# Patient Record
Sex: Female | Born: 1970
Health system: Southern US, Community
[De-identification: ages and names within clinical notes are randomized; demographics above are authoritative.]

## PROBLEM LIST (undated history)

## (undated) DIAGNOSIS — J449 Chronic obstructive pulmonary disease, unspecified: Secondary | ICD-10-CM

## (undated) DIAGNOSIS — F419 Anxiety disorder, unspecified: Secondary | ICD-10-CM

## (undated) DIAGNOSIS — O009 Unspecified ectopic pregnancy without intrauterine pregnancy: Secondary | ICD-10-CM

## (undated) HISTORY — PX: OOPHORECTOMY: SHX86

## (undated) HISTORY — PX: BACK SURGERY: SHX140

## (undated) HISTORY — DX: Anxiety disorder, unspecified: F41.9

---

## 2004-01-03 ENCOUNTER — Ambulatory Visit: Payer: Self-pay | Admitting: Pain Medicine

## 2004-02-08 ENCOUNTER — Ambulatory Visit: Payer: Self-pay | Admitting: Pain Medicine

## 2004-03-06 ENCOUNTER — Ambulatory Visit: Payer: Self-pay | Admitting: Pain Medicine

## 2004-04-04 ENCOUNTER — Ambulatory Visit: Payer: Self-pay | Admitting: Pain Medicine

## 2004-05-03 ENCOUNTER — Ambulatory Visit: Payer: Self-pay | Admitting: Pain Medicine

## 2004-06-04 ENCOUNTER — Ambulatory Visit: Payer: Self-pay | Admitting: Pain Medicine

## 2004-07-05 ENCOUNTER — Ambulatory Visit: Payer: Self-pay | Admitting: Pain Medicine

## 2004-07-11 ENCOUNTER — Ambulatory Visit: Payer: Self-pay | Admitting: Pain Medicine

## 2004-07-25 ENCOUNTER — Emergency Department: Payer: Self-pay | Admitting: Emergency Medicine

## 2004-07-26 ENCOUNTER — Ambulatory Visit: Payer: Self-pay | Admitting: Emergency Medicine

## 2004-08-02 ENCOUNTER — Ambulatory Visit: Payer: Self-pay | Admitting: Pain Medicine

## 2004-08-30 ENCOUNTER — Ambulatory Visit: Payer: Self-pay | Admitting: Pain Medicine

## 2004-09-20 ENCOUNTER — Ambulatory Visit: Payer: Self-pay | Admitting: Pain Medicine

## 2004-09-27 ENCOUNTER — Ambulatory Visit: Payer: Self-pay | Admitting: Pain Medicine

## 2004-10-25 ENCOUNTER — Ambulatory Visit: Payer: Self-pay | Admitting: Pain Medicine

## 2004-11-20 ENCOUNTER — Ambulatory Visit: Payer: Self-pay | Admitting: Pain Medicine

## 2004-12-19 ENCOUNTER — Ambulatory Visit: Payer: Self-pay | Admitting: Pain Medicine

## 2005-01-17 ENCOUNTER — Ambulatory Visit: Payer: Self-pay | Admitting: Pain Medicine

## 2005-02-14 ENCOUNTER — Ambulatory Visit: Payer: Self-pay | Admitting: Pain Medicine

## 2005-03-06 ENCOUNTER — Ambulatory Visit: Payer: Self-pay | Admitting: Pain Medicine

## 2005-03-14 ENCOUNTER — Ambulatory Visit: Payer: Self-pay | Admitting: Pain Medicine

## 2005-04-02 ENCOUNTER — Ambulatory Visit: Payer: Self-pay | Admitting: Pain Medicine

## 2005-04-08 ENCOUNTER — Ambulatory Visit: Payer: Self-pay | Admitting: Pain Medicine

## 2005-04-09 ENCOUNTER — Ambulatory Visit: Payer: Self-pay | Admitting: Internal Medicine

## 2005-05-14 ENCOUNTER — Ambulatory Visit: Payer: Self-pay | Admitting: Pain Medicine

## 2005-05-22 ENCOUNTER — Ambulatory Visit: Payer: Self-pay | Admitting: Pain Medicine

## 2005-06-10 ENCOUNTER — Emergency Department: Payer: Self-pay | Admitting: Emergency Medicine

## 2005-06-13 ENCOUNTER — Ambulatory Visit: Payer: Self-pay | Admitting: Pain Medicine

## 2005-07-17 ENCOUNTER — Ambulatory Visit: Payer: Self-pay | Admitting: Pain Medicine

## 2005-08-15 ENCOUNTER — Ambulatory Visit: Payer: Self-pay | Admitting: Pain Medicine

## 2005-08-21 ENCOUNTER — Ambulatory Visit: Payer: Self-pay | Admitting: Pain Medicine

## 2005-09-12 ENCOUNTER — Ambulatory Visit: Payer: Self-pay | Admitting: Pain Medicine

## 2005-09-23 ENCOUNTER — Ambulatory Visit: Payer: Self-pay | Admitting: Pain Medicine

## 2005-10-08 ENCOUNTER — Ambulatory Visit: Payer: Self-pay | Admitting: Pain Medicine

## 2005-11-05 ENCOUNTER — Ambulatory Visit: Payer: Self-pay | Admitting: Pain Medicine

## 2005-11-11 ENCOUNTER — Ambulatory Visit: Payer: Self-pay | Admitting: Pain Medicine

## 2005-12-03 ENCOUNTER — Ambulatory Visit: Payer: Self-pay | Admitting: Pain Medicine

## 2005-12-04 ENCOUNTER — Ambulatory Visit: Payer: Self-pay | Admitting: Pain Medicine

## 2005-12-24 ENCOUNTER — Ambulatory Visit: Payer: Self-pay | Admitting: Pain Medicine

## 2006-01-08 ENCOUNTER — Ambulatory Visit: Payer: Self-pay | Admitting: Pain Medicine

## 2006-01-23 ENCOUNTER — Ambulatory Visit: Payer: Self-pay | Admitting: Pain Medicine

## 2006-01-29 ENCOUNTER — Ambulatory Visit: Payer: Self-pay | Admitting: Pain Medicine

## 2006-02-24 ENCOUNTER — Ambulatory Visit: Payer: Self-pay | Admitting: Pain Medicine

## 2006-03-12 ENCOUNTER — Ambulatory Visit: Payer: Self-pay | Admitting: Pain Medicine

## 2006-04-17 ENCOUNTER — Emergency Department: Payer: Self-pay | Admitting: Emergency Medicine

## 2006-04-17 ENCOUNTER — Ambulatory Visit: Payer: Self-pay | Admitting: Pain Medicine

## 2006-04-25 IMAGING — CR DG CHEST 2V
1 series · 2 of 2 positions shown · non-contrast
Comparison: none

REASON FOR EXAM: COUGH
COMMENTS:

PROCEDURE:     DXR - DXR CHEST PA (OR AP) AND LATERAL  - April 09, 2005  [DATE]
RESULT:     The mediastinum and hilar structures are normal. The lungs are
clear. Cardiovascular structures are unremarkable.

[Series 3178: postero_anterior · 0.11mm/px · 2 of 2 slices shown]
[im 1/2]
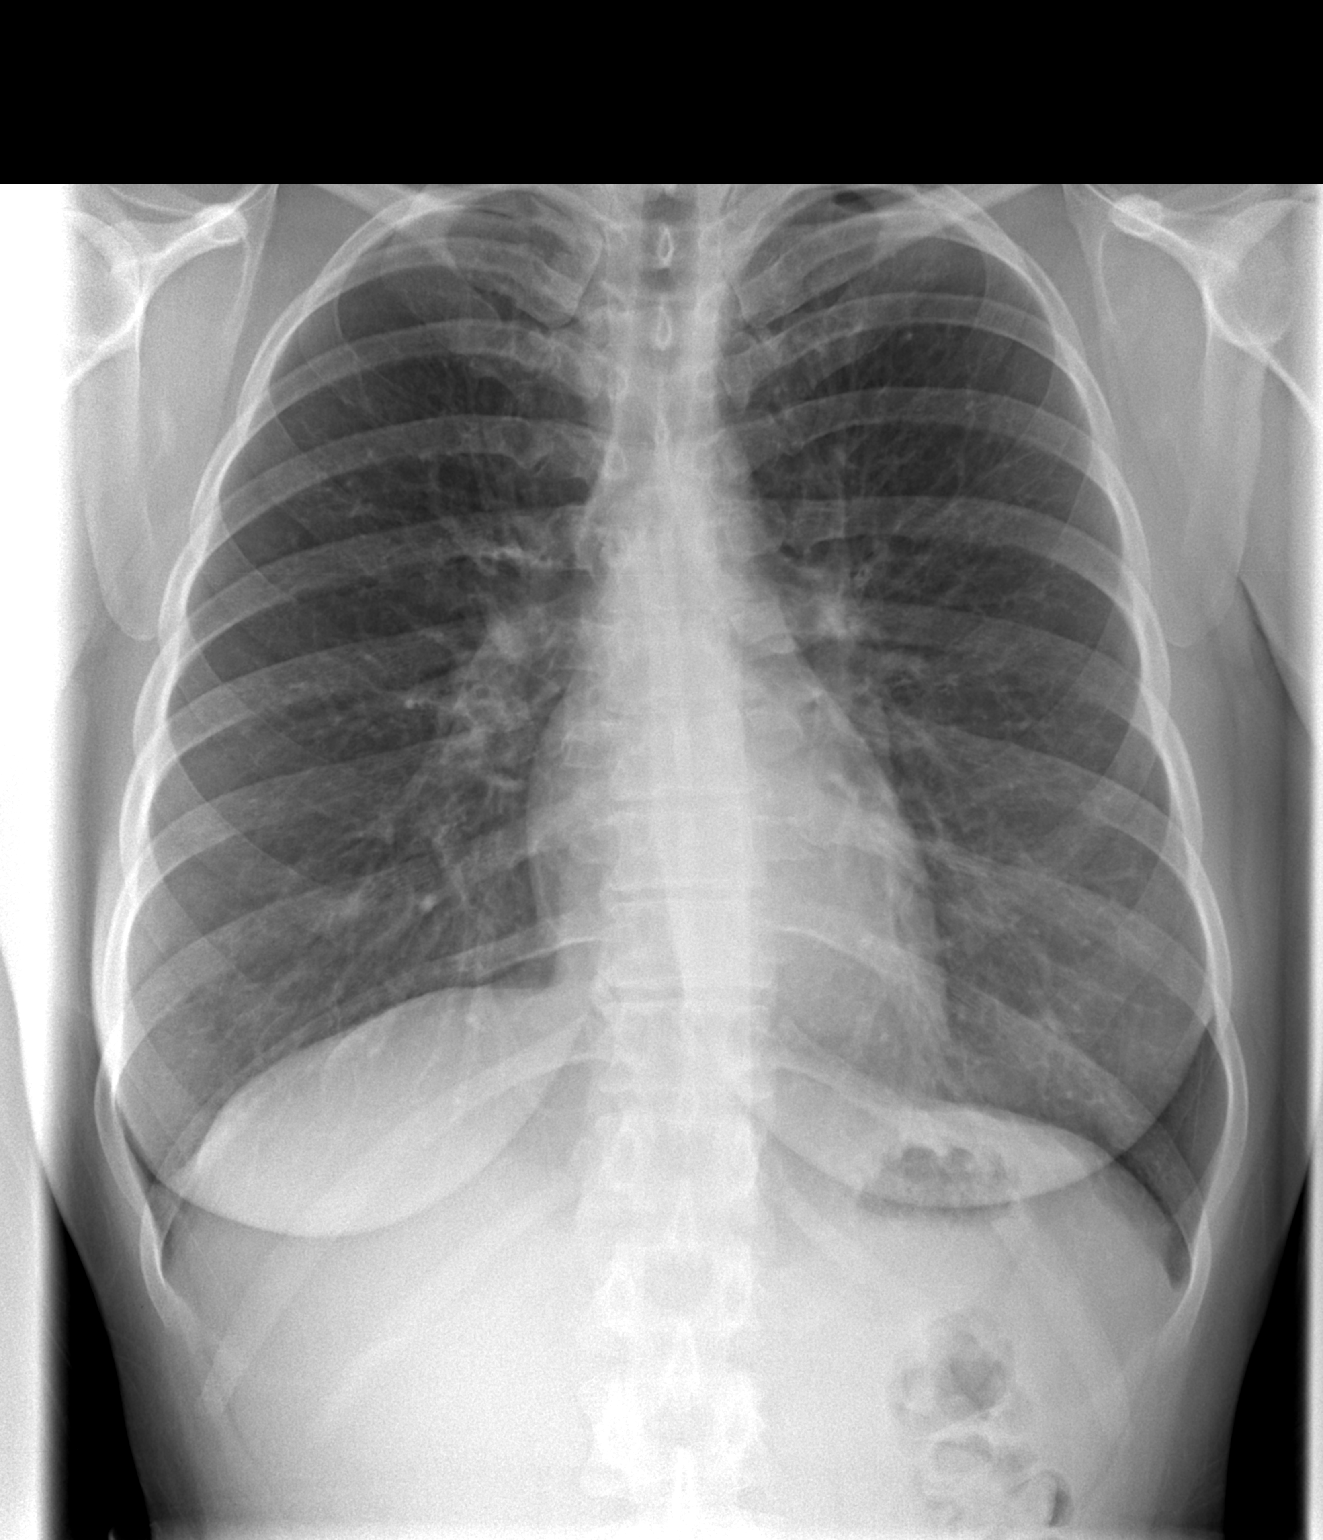
[im 2/2]
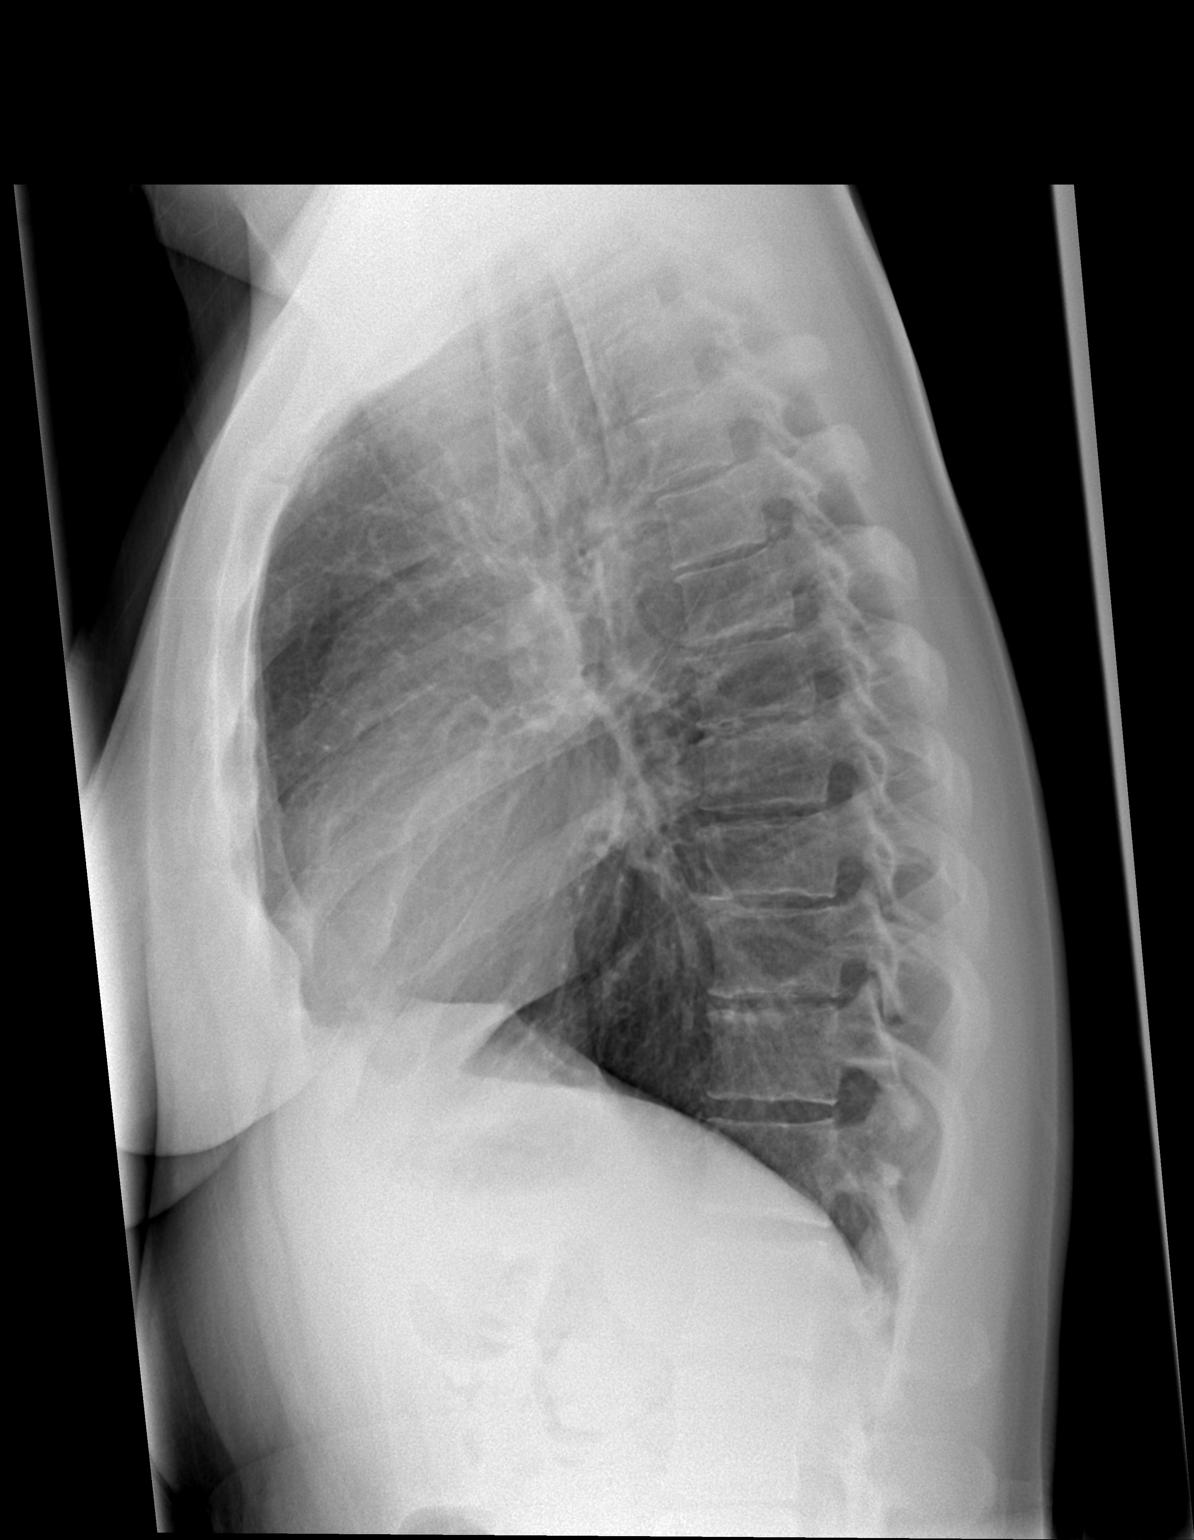

[2 of 2 positions shown; findings below may reference images not displayed]

IMPRESSION: 1)No acute cardiopulmonary disease.

## 2006-04-30 ENCOUNTER — Ambulatory Visit: Payer: Self-pay | Admitting: Pain Medicine

## 2006-05-20 ENCOUNTER — Ambulatory Visit: Payer: Self-pay | Admitting: Pain Medicine

## 2006-06-02 ENCOUNTER — Ambulatory Visit: Payer: Self-pay | Admitting: Pain Medicine

## 2006-06-19 ENCOUNTER — Ambulatory Visit: Payer: Self-pay | Admitting: Pain Medicine

## 2006-07-14 ENCOUNTER — Ambulatory Visit: Payer: Self-pay | Admitting: Pain Medicine

## 2006-08-12 ENCOUNTER — Ambulatory Visit: Payer: Self-pay | Admitting: Pain Medicine

## 2006-09-08 ENCOUNTER — Ambulatory Visit: Payer: Self-pay | Admitting: Pain Medicine

## 2006-09-16 ENCOUNTER — Ambulatory Visit: Payer: Self-pay | Admitting: Pain Medicine

## 2006-09-29 ENCOUNTER — Ambulatory Visit: Payer: Self-pay | Admitting: Pain Medicine

## 2006-10-16 ENCOUNTER — Ambulatory Visit: Payer: Self-pay | Admitting: Pain Medicine

## 2006-11-03 ENCOUNTER — Ambulatory Visit: Payer: Self-pay | Admitting: Pain Medicine

## 2006-11-13 ENCOUNTER — Ambulatory Visit: Payer: Self-pay | Admitting: Pain Medicine

## 2006-12-04 ENCOUNTER — Ambulatory Visit: Payer: Self-pay | Admitting: Pain Medicine

## 2007-01-05 ENCOUNTER — Ambulatory Visit: Payer: Self-pay | Admitting: Pain Medicine

## 2007-02-03 ENCOUNTER — Ambulatory Visit: Payer: Self-pay | Admitting: Pain Medicine

## 2007-03-21 ENCOUNTER — Ambulatory Visit: Payer: Self-pay | Admitting: Unknown Physician Specialty

## 2007-06-19 ENCOUNTER — Ambulatory Visit: Payer: Self-pay | Admitting: Unknown Physician Specialty

## 2007-06-19 ENCOUNTER — Other Ambulatory Visit: Payer: Self-pay

## 2007-06-23 ENCOUNTER — Inpatient Hospital Stay: Payer: Self-pay | Admitting: Unknown Physician Specialty

## 2007-10-19 ENCOUNTER — Ambulatory Visit: Payer: Self-pay | Admitting: Unknown Physician Specialty

## 2009-03-19 ENCOUNTER — Inpatient Hospital Stay: Payer: Self-pay | Admitting: Unknown Physician Specialty

## 2009-08-14 ENCOUNTER — Inpatient Hospital Stay: Payer: Self-pay | Admitting: Unknown Physician Specialty

## 2014-11-24 ENCOUNTER — Ambulatory Visit: Payer: Self-pay | Admitting: Family Medicine

## 2014-12-16 ENCOUNTER — Ambulatory Visit
Admission: RE | Admit: 2014-12-16 | Discharge: 2014-12-16 | Disposition: A | Discharge status: Home / Self Care | Payer: Disability Insurance | Source: Ambulatory Visit | Attending: Family Medicine | Admitting: Family Medicine

## 2014-12-16 ENCOUNTER — Other Ambulatory Visit: Payer: Self-pay | Admitting: Family Medicine

## 2014-12-16 DIAGNOSIS — M549 Dorsalgia, unspecified: Secondary | ICD-10-CM

## 2014-12-16 DIAGNOSIS — M5136 Other intervertebral disc degeneration, lumbar region: Secondary | ICD-10-CM | POA: Insufficient documentation

## 2014-12-16 DIAGNOSIS — M47816 Spondylosis without myelopathy or radiculopathy, lumbar region: Secondary | ICD-10-CM | POA: Insufficient documentation

## 2016-02-29 ENCOUNTER — Emergency Department: Payer: Medicare Other

## 2016-02-29 ENCOUNTER — Inpatient Hospital Stay
Admission: EM | Admit: 2016-02-29 | Discharge: 2016-03-04 | DRG: 871 | Disposition: A | Discharge status: Home / Self Care | Payer: Medicare Other | Attending: Internal Medicine | Admitting: Internal Medicine

## 2016-02-29 ENCOUNTER — Encounter: Payer: Self-pay | Admitting: Emergency Medicine

## 2016-02-29 DIAGNOSIS — A419 Sepsis, unspecified organism: Secondary | ICD-10-CM | POA: Diagnosis present

## 2016-02-29 DIAGNOSIS — E860 Dehydration: Secondary | ICD-10-CM | POA: Diagnosis present

## 2016-02-29 DIAGNOSIS — E872 Acidosis: Secondary | ICD-10-CM | POA: Diagnosis present

## 2016-02-29 DIAGNOSIS — Z23 Encounter for immunization: Secondary | ICD-10-CM | POA: Diagnosis present

## 2016-02-29 DIAGNOSIS — E876 Hypokalemia: Secondary | ICD-10-CM | POA: Diagnosis present

## 2016-02-29 DIAGNOSIS — Z87891 Personal history of nicotine dependence: Secondary | ICD-10-CM

## 2016-02-29 DIAGNOSIS — J440 Chronic obstructive pulmonary disease with acute lower respiratory infection: Secondary | ICD-10-CM | POA: Diagnosis present

## 2016-02-29 DIAGNOSIS — R0602 Shortness of breath: Secondary | ICD-10-CM | POA: Diagnosis present

## 2016-02-29 DIAGNOSIS — E871 Hypo-osmolality and hyponatremia: Secondary | ICD-10-CM | POA: Diagnosis present

## 2016-02-29 DIAGNOSIS — N179 Acute kidney failure, unspecified: Secondary | ICD-10-CM | POA: Diagnosis present

## 2016-02-29 DIAGNOSIS — J189 Pneumonia, unspecified organism: Secondary | ICD-10-CM | POA: Diagnosis present

## 2016-02-29 DIAGNOSIS — J181 Lobar pneumonia, unspecified organism: Secondary | ICD-10-CM

## 2016-02-29 HISTORY — DX: Unspecified ectopic pregnancy without intrauterine pregnancy: O00.90

## 2016-02-29 HISTORY — DX: Chronic obstructive pulmonary disease, unspecified: J44.9

## 2016-02-29 LAB — TROPONIN I: Troponin I: 0.03 ng/mL (ref <0.03)

## 2016-02-29 LAB — CBC
HCT: 48.3 % — ABNORMAL HIGH (ref 35.0–47.0)
HEMOGLOBIN: 16.4 g/dL — AB (ref 12.0–16.0)
MCH: 29.6 pg (ref 26.0–34.0)
MCHC: 34 g/dL (ref 32.0–36.0)
MCV: 87.3 fL (ref 80.0–100.0)
Platelets: 314 10*3/uL (ref 150–440)
RBC: 5.53 MIL/uL — AB (ref 3.80–5.20)
RDW: 13.7 % (ref 11.5–14.5)
WBC: 18.6 10*3/uL — ABNORMAL HIGH (ref 3.6–11.0)

## 2016-02-29 LAB — BASIC METABOLIC PANEL
ANION GAP: 18 — AB (ref 5–15)
BUN: 39 mg/dL — ABNORMAL HIGH (ref 6–20)
CHLORIDE: 97 mmol/L — AB (ref 101–111)
CO2: 19 mmol/L — AB (ref 22–32)
Calcium: 8.7 mg/dL — ABNORMAL LOW (ref 8.9–10.3)
Creatinine, Ser: 1.74 mg/dL — ABNORMAL HIGH (ref 0.44–1.00)
GFR calc non Af Amer: 35 mL/min — ABNORMAL LOW (ref >60)
GFR, EST AFRICAN AMERICAN: 40 mL/min — AB (ref >60)
Glucose, Bld: 134 mg/dL — ABNORMAL HIGH (ref 65–99)
Potassium: 3.1 mmol/L — ABNORMAL LOW (ref 3.5–5.1)
Sodium: 134 mmol/L — ABNORMAL LOW (ref 135–145)

## 2016-02-29 LAB — LACTIC ACID, PLASMA
Lactic Acid, Venous: 2.5 mmol/L (ref 0.5–1.9)
Lactic Acid, Venous: 3 mmol/L (ref 0.5–1.9)
Lactic Acid, Venous: 2.5 mmol/L (ref 0.5–1.9)

## 2016-02-29 MED ORDER — DEXTROSE 5 % IV SOLN
500.0000 mg | Freq: Once | INTRAVENOUS | Status: AC
  Administered 2016-02-29: 500 mg via INTRAVENOUS
  Filled 2016-02-29: qty 500

## 2016-02-29 MED ORDER — ONDANSETRON HCL 4 MG/2ML IJ SOLN
4.0000 mg | Freq: Once | INTRAMUSCULAR | Status: AC
  Administered 2016-02-29: 4 mg via INTRAVENOUS

## 2016-02-29 MED ORDER — SODIUM CHLORIDE 0.9 % IV BOLUS (SEPSIS)
1000.0000 mL | Freq: Once | INTRAVENOUS | Status: AC
  Administered 2016-02-29: 1000 mL via INTRAVENOUS

## 2016-02-29 MED ORDER — ALBUTEROL SULFATE (2.5 MG/3ML) 0.083% IN NEBU
INHALATION_SOLUTION | RESPIRATORY_TRACT | Status: AC
  Administered 2016-02-29: 5 mg via RESPIRATORY_TRACT
  Filled 2016-02-29: qty 3

## 2016-02-29 MED ORDER — INFLUENZA VAC SPLIT QUAD 0.5 ML IM SUSY
0.5000 mL | PREFILLED_SYRINGE | INTRAMUSCULAR | Status: AC
  Administered 2016-03-01: 14:00:00 0.5 mL via INTRAMUSCULAR
  Filled 2016-02-29 (×2): qty 0.5

## 2016-02-29 MED ORDER — POTASSIUM CHLORIDE IN NACL 40-0.9 MEQ/L-% IV SOLN
INTRAVENOUS | Status: DC
  Administered 2016-02-29 – 2016-03-02 (×4): 125 mL/h via INTRAVENOUS
  Filled 2016-02-29 (×7): qty 1000

## 2016-02-29 MED ORDER — TRAMADOL HCL 50 MG PO TABS
50.0000 mg | ORAL_TABLET | Freq: Four times a day (QID) | ORAL | Status: DC | PRN
  Administered 2016-02-29: 23:00:00 50 mg via ORAL
  Filled 2016-02-29: qty 1

## 2016-02-29 MED ORDER — BUPRENORPHINE HCL 2 MG SL SUBL
8.0000 mg | SUBLINGUAL_TABLET | Freq: Two times a day (BID) | SUBLINGUAL | Status: DC
  Administered 2016-02-29 – 2016-03-04 (×8): 8 mg via SUBLINGUAL
  Filled 2016-02-29 (×8): qty 4

## 2016-02-29 MED ORDER — ONDANSETRON HCL 4 MG PO TABS: 4.0000 mg | ORAL_TABLET | Freq: Four times a day (QID) | ORAL | Status: DC | PRN

## 2016-02-29 MED ORDER — KETOROLAC TROMETHAMINE 15 MG/ML IJ SOLN
15.0000 mg | Freq: Four times a day (QID) | INTRAMUSCULAR | Status: DC | PRN
  Administered 2016-03-01 – 2016-03-04 (×6): 15 mg via INTRAVENOUS
  Filled 2016-02-29 (×6): qty 1

## 2016-02-29 MED ORDER — ONDANSETRON HCL 4 MG/2ML IJ SOLN
INTRAMUSCULAR | Status: AC
  Administered 2016-02-29: 4 mg via INTRAVENOUS
  Filled 2016-02-29: qty 2

## 2016-02-29 MED ORDER — CEFTRIAXONE SODIUM-DEXTROSE 1-3.74 GM-% IV SOLR
INTRAVENOUS | Status: AC
Start: 2016-02-29 — End: 2016-02-29
  Administered 2016-02-29: 1 g via INTRAVENOUS
  Filled 2016-02-29: qty 50

## 2016-02-29 MED ORDER — SENNOSIDES-DOCUSATE SODIUM 8.6-50 MG PO TABS: 1.0000 | ORAL_TABLET | Freq: Every evening | ORAL | Status: DC | PRN

## 2016-02-29 MED ORDER — DEXTROSE 5 % IV SOLN: 1.0000 g | INTRAVENOUS | Status: DC

## 2016-02-29 MED ORDER — ALBUTEROL SULFATE (2.5 MG/3ML) 0.083% IN NEBU
5.0000 mg | INHALATION_SOLUTION | Freq: Once | RESPIRATORY_TRACT | Status: AC
  Administered 2016-02-29: 5 mg via RESPIRATORY_TRACT
  Filled 2016-02-29: qty 3

## 2016-02-29 MED ORDER — ENOXAPARIN SODIUM 40 MG/0.4ML ~~LOC~~ SOLN
40.0000 mg | SUBCUTANEOUS | Status: DC
  Administered 2016-02-29 – 2016-03-03 (×4): 40 mg via SUBCUTANEOUS
  Filled 2016-02-29 (×4): qty 0.4

## 2016-02-29 MED ORDER — ACETAMINOPHEN 325 MG PO TABS: 650.0000 mg | ORAL_TABLET | Freq: Four times a day (QID) | ORAL | Status: DC | PRN

## 2016-02-29 MED ORDER — LIDOCAINE 5 % EX PTCH
1.0000 | MEDICATED_PATCH | CUTANEOUS | Status: DC
  Administered 2016-02-29 – 2016-03-03 (×4): 1 via TRANSDERMAL
  Filled 2016-02-29 (×6): qty 1

## 2016-02-29 MED ORDER — PNEUMOCOCCAL VAC POLYVALENT 25 MCG/0.5ML IJ INJ
0.5000 mL | INJECTION | INTRAMUSCULAR | Status: AC
  Administered 2016-03-01: 0.5 mL via INTRAMUSCULAR
  Filled 2016-02-29 (×3): qty 0.5

## 2016-02-29 MED ORDER — DEXTROSE 5 % IV SOLN
500.0000 mg | INTRAVENOUS | Status: DC
  Filled 2016-02-29: qty 500

## 2016-02-29 MED ORDER — ALBUTEROL SULFATE (2.5 MG/3ML) 0.083% IN NEBU
2.5000 mg | INHALATION_SOLUTION | RESPIRATORY_TRACT | Status: DC | PRN
  Administered 2016-03-02: 10:00:00 2.5 mg via RESPIRATORY_TRACT
  Filled 2016-02-29: qty 3

## 2016-02-29 MED ORDER — SODIUM CHLORIDE 0.9 % IV BOLUS (SEPSIS)
500.0000 mL | Freq: Once | INTRAVENOUS | Status: AC
  Administered 2016-02-29: 21:00:00 500 mL via INTRAVENOUS

## 2016-02-29 MED ORDER — CEFTRIAXONE SODIUM-DEXTROSE 1-3.74 GM-% IV SOLR
1.0000 g | INTRAVENOUS | Status: DC
  Administered 2016-03-01 – 2016-03-03 (×3): 1 g via INTRAVENOUS
  Filled 2016-02-29 (×3): qty 50

## 2016-02-29 MED ORDER — ACETAMINOPHEN 650 MG RE SUPP: 650.0000 mg | Freq: Four times a day (QID) | RECTAL | Status: DC | PRN

## 2016-02-29 MED ORDER — ONDANSETRON HCL 4 MG/2ML IJ SOLN
4.0000 mg | Freq: Four times a day (QID) | INTRAMUSCULAR | Status: DC | PRN
  Administered 2016-03-02 – 2016-03-04 (×3): 4 mg via INTRAVENOUS
  Filled 2016-02-29 (×4): qty 2

## 2016-02-29 MED ORDER — CEFTRIAXONE SODIUM-DEXTROSE 1-3.74 GM-% IV SOLR
1.0000 g | Freq: Once | INTRAVENOUS | Status: AC
  Administered 2016-02-29: 1 g via INTRAVENOUS

## 2016-02-29 MED ORDER — DEXTROSE 5 % IV SOLN: 1.0000 g | Freq: Once | INTRAVENOUS | Status: DC

## 2016-02-29 NOTE — ED Notes (Signed)
Cough, fever x5 days , mask applied in lobby

## 2016-02-29 NOTE — ED Notes (Signed)
Pt oxygen dropped to upper 70's, highest reading this RN saw on RA was 88%. Pt placed on 2 L nasal cannula.

## 2016-02-29 NOTE — H&P (Signed)
[facility] [place] at [facility]   PATIENT NAME: Andrea Kane    MR#:  [id number]  DATE OF BIRTH:  12/17/1970  DATE OF ADMISSION:  02/29/2016  PRIMARY CARE PHYSICIAN: Derwood KaplanEason,  Ernest B, MD   REQUESTING/REFERRING PHYSICIAN: Dr. Shaune PollackLord  CHIEF COMPLAINT:   High-grade fever cough and chest pain for 5 days HISTORY OF PRESENT ILLNESS:  Andrea Kane  is a 45 y.o. female with a known history of COPD who quit smoking a few years ago. The emergency room with fever of 102, left-sided pleuritic chest pain and cough productive of phlegm. Workup in the emergency showed patient has left middle and lower lobe dense pneumonia. She received IV Rocephin and teeth are maximal in the blood cultures were drawn. Patient is hemodynamically stable and not wheezing. She does not use chronic home oxygen. She is being admitted for sepsis secondary to pneumonia. Patient was tachycardic tachypneic with elevated white count.  PAST MEDICAL HISTORY:   Past Medical History:  Diagnosis Date  . COPD (chronic obstructive pulmonary disease) (HCC)   . Ectopic pregnancy     PAST SURGICAL HISTOIRY:   Past Surgical History:  Procedure Laterality Date  . BACK SURGERY    . OOPHORECTOMY Left     SOCIAL HISTORY:   Social History  Substance Use Topics  . Smoking status: Former Games developermoker  . Smokeless tobacco: Never Used  . Alcohol use No    FAMILY HISTORY:  No family history on file.  DRUG ALLERGIES:  No Known Allergies  REVIEW OF SYSTEMS:  Review of Systems  Constitutional: Positive for chills and fever. Negative for weight loss.  HENT: Negative for ear discharge, ear pain and nosebleeds.   Eyes: Negative for blurred vision, pain and discharge.  Respiratory: Positive for sputum production and shortness of breath. Negative for wheezing and stridor.   Cardiovascular: Positive for chest pain. Negative for palpitations, orthopnea and PND.  Gastrointestinal: Negative for abdominal pain,  diarrhea, nausea and vomiting.  Genitourinary: Negative for frequency and urgency.  Musculoskeletal: Negative for back pain and joint pain.  Neurological: Positive for weakness. Negative for sensory change, speech change and focal weakness.  Psychiatric/Behavioral: Negative for depression and hallucinations. The patient is not nervous/anxious.      MEDICATIONS AT HOME:   Prior to Admission medications   Medication Sig Start Date End Date Taking? Authorizing Provider  SUBOXONE 8-2 MG FILM Place 1 Film under the tongue 2 (two) times daily. 02/06/16  Yes Historical Provider, MD      VITAL SIGNS:  Blood pressure 97/68, pulse 100, temperature 98.2 F (36.8 C), temperature source Oral, resp. rate (!) 21, height 5\' 1"  (1.549 m), weight 85.7 kg (189 lb), last menstrual period 01/24/2016, SpO2 (!) 88 %.  PHYSICAL EXAMINATION:  GENERAL:  45 y.o.-year-old patient lying in the bed with no acute distress.looks ill!!  EYES: Pupils equal, round, reactive to light and accommodation. No scleral icterus. Extraocular muscles intact.  HEENT: Head atraumatic, normocephalic. Oropharynx and nasopharynx clear.  NECK:  Supple, no jugular venous distention. No thyroid enlargement, no tenderness.  LUNGS:decreasedl breath sounds bilaterally, no wheezing, rales,rhonchi or crepitation. No use of accessory muscles of respiration.  CARDIOVASCULAR: S1, S2 normal. No murmurs, rubs, or gallops.  ABDOMEN: Soft, nontender, nondistended. Bowel sounds present. No organomegaly or mass.  EXTREMITIES: No pedal edema, cyanosis, or clubbing.  NEUROLOGIC: Cranial nerves II through XII are intact. Muscle strength 5/5 in all extremities. Sensation intact. Gait not checked.  PSYCHIATRIC: The patient is  alert and oriented x 3.  SKIN: No obvious rash, lesion, or ulcer.   LABORATORY PANEL:   CBC  Recent Labs Lab 02/29/16 1239  WBC 18.6*  HGB 16.4*  HCT 48.3*  PLT 314    ------------------------------------------------------------------------------------------------------------------  Chemistries   Recent Labs Lab 02/29/16 1239  NA 134*  K 3.1*  CL 97*  CO2 19*  GLUCOSE 134*  BUN 39*  CREATININE 1.74*  CALCIUM 8.7*   ------------------------------------------------------------------------------------------------------------------  Cardiac Enzymes No results for input(s): TROPONINI in the last 168 hours. ------------------------------------------------------------------------------------------------------------------  RADIOLOGY:  Dg Chest 2 View  Result Date: 02/29/2016 CLINICAL DATA:  Pt c/o cough with congestion and SOB with left rib pain for the past 5 days. Pt has audible congestive breathe sounds in triage.COPD, smoker EXAM: CHEST  2 VIEW COMPARISON:  06/19/2007 FINDINGS: There is dense consolidation throughout the middle to lower aspect of the left upper lobe. The remainder of the left lung and right lung are clear. No pleural effusion.  No pneumothorax. Cardiac silhouette is normal in size. No mediastinal or hilar masses. No convincing adenopathy. Skeletal structures are intact. IMPRESSION: Left upper lobe pneumonia. Electronically Signed   By: Amie Portlandavid  Ormond M.D.   On: 02/29/2016 13:38    EKG:  Sinus tachycardia  IMPRESSION AND PLAN:    Andrea Kane  is a 45 y.o. female with a known history of COPD who quit smoking a few years ago. The emergency room with fever of 102, left-sided pleuritic chest pain and cough productive of phlegm. Workup in the emergency showed patient has left middle and lower lobe dense pneumonia.   1. Sepsis due to left middle lobe lower lobe dense consolidation/community-acquired pneumonia -Patient presented with fever 102, tachycardia, tachypnea, leukocytosis and positive chest x-ray, lactic acidosis -IV Rocephin and Zithromax -Follow blood cultures and white count -Incentive spirometer  2. History of  COPD -No wheezing sats stable -Weaned to room air as oxygen stay stable. No indication for IV steroids. -Continue inhalers and breathing treatment as needed  3. Acute renal failure secondary to dehydration in the setting of pneumonia with mild hyponatremia -IV fluids, encourage oral fluids  4. Hypokalemia replete with IV fluids  5. DVT prophylaxis subcutaneous Lovenox  Above was discussed with patient and patient's mother in the ER   All the records are reviewed and case discussed with ED provider. Management plans discussed with the patient, family and they are in agreement.  CODE STATUS: Full  TOTAL TIME TAKING CARE OF THIS PATIENT: 50 minutes.    Hearl Heikes M.D on 02/29/2016 at 4:58 PM  Between 7am to 6pm - Pager - [phone]  After 6pm go to [email or web address] - password [email or web address] [facility]  [facility]  Office  [phone]  CC: Primary care physician; Derwood KaplanEason,  Ernest B, MD

## 2016-02-29 NOTE — ED Notes (Signed)
Patient transported to X-ray 

## 2016-02-29 NOTE — ED Notes (Signed)
Patient ambulated to restroom.

## 2016-02-29 NOTE — ED Provider Notes (Signed)
[facility] Emergency Department Provider Note ____________________________________________   I have reviewed the triage vital signs and the triage nursing note.  HISTORY  Chief Complaint Shortness of Breath   Historian Patient  HPI Andrea Kane is a 45 y.o. female with history of COPD for which she only occasionally needs albuterol and does not really follow with a primary care doctor right now, although in the past she has seen Dr. Maryellen PileEason, presents for 5 days of increased cough and shortness of breath. She's also had a fever at home up to 102.  Her family wanted her to get seen yesterday, but she decided to come today. Some chest pain over on the left lateral ribs especially with breathing.  Is in moderate to severe. Any exertion makes it worse.    Past Medical History:  Diagnosis Date  . COPD (chronic obstructive pulmonary disease) (HCC)   . Ectopic pregnancy     There are no active problems to display for this patient.   Past Surgical History:  Procedure Laterality Date  . BACK SURGERY    . OOPHORECTOMY Left     Prior to Admission medications   Not on File    No Known Allergies  No family history on file.  Social History Social History  Substance Use Topics  . Smoking status: Former Games developermoker  . Smokeless tobacco: Never Used  . Alcohol use No    Review of Systems  Constitutional: Positive for fever. Eyes: Negative for visual changes. ENT: Negative for sore throat. Cardiovascular: Positive for chest pain. Respiratory: Positive for shortness of breath. Gastrointestinal: Negative for abdominal pain, vomiting and diarrhea.  Some nausea and decreased by mouth intake. Genitourinary: Negative for dysuria. Musculoskeletal: Negative for back pain. Skin: Negative for rash. Neurological: Negative for headache. 10 point Review of Systems otherwise negative ____________________________________________   PHYSICAL EXAM:  VITAL  SIGNS: ED Triage Vitals  Enc Vitals Group     BP 02/29/16 1210 115/90     Pulse Rate 02/29/16 1210 (!) 132     Resp 02/29/16 1210 (!) 28     Temp 02/29/16 1210 98.2 F (36.8 C)     Temp Source 02/29/16 1210 Oral     SpO2 02/29/16 1210 93 %     Weight 02/29/16 1211 189 lb (85.7 kg)     Height 02/29/16 1211 5\' 1"  (1.549 m)     Head Circumference --      Peak Flow --      Pain Score 02/29/16 1217 8     Pain Loc --      Pain Edu? --      Excl. in GC? --      Constitutional: Alert and oriented.Looks like she doesn't feel too well, but no respiratory distress. HEENT   Head: Normocephalic and atraumatic.      Eyes: Conjunctivae are normal. PERRL. Normal extraocular movements.      Ears:         Nose: No congestion/rhinnorhea.   Mouth/Throat: Mucous membranes are mildly dry.   Neck: No stridor. Cardiovascular/Chest: Tachycardic, regular rhythm.  No murmurs, rubs, or gallops. Respiratory: Normal respiratory effort without tachypnea nor retractions. Significant rhonchi and intermittent cough. Decreased breath sounds left posterior. No wheezing. Gastrointestinal: Soft. No distention, no guarding, no rebound. Nontender.    Genitourinary/rectal:Deferred Musculoskeletal: Nontender with normal range of motion in all extremities. No joint effusions.  No lower extremity tenderness.  No edema. Neurologic:  Normal speech and language. No gross or focal  neurologic deficits are appreciated. Skin:  Skin is warm, dry and intact. No rash noted. Psychiatric: Mood and affect are normal. Speech and behavior are normal. Patient exhibits appropriate insight and judgment.   ____________________________________________  LABS (pertinent positives/negatives)  Labs Reviewed  BASIC METABOLIC PANEL - Abnormal; Notable for the following:       Result Value   Sodium 134 (*)    Potassium 3.1 (*)    Chloride 97 (*)    CO2 19 (*)    Glucose, Bld 134 (*)    BUN 39 (*)    Creatinine, Ser 1.74 (*)     Calcium 8.7 (*)    GFR calc non Af Amer 35 (*)    GFR calc Af Amer 40 (*)    Anion gap 18 (*)    All other components within normal limits  CBC - Abnormal; Notable for the following:    WBC 18.6 (*)    RBC 5.53 (*)    Hemoglobin 16.4 (*)    HCT 48.3 (*)    All other components within normal limits  CULTURE, BLOOD (ROUTINE X 2)  CULTURE, BLOOD (ROUTINE X 2)  TROPONIN I  LACTIC ACID, PLASMA  LACTIC ACID, PLASMA    ____________________________________________    EKG I, Governor Rooksebecca Nickoles Gregori, MD, the attending physician have personally viewed and interpreted all ECGs.  122 bpm. Sinus tachycardia. Narrow QRS. Normal axis. Nonspecific ST and T-wave ____________________________________________  RADIOLOGY All Xrays were viewed by me. Imaging interpreted by Radiologist.  Chest x-ray two-view: Left upper lobe pneumonia. __________________________________________  PROCEDURES  Procedure(s) performed: None  Critical Care performed: None  ____________________________________________   ED COURSE / ASSESSMENT AND PLAN  Pertinent labs & imaging results that were available during my care of the patient were reviewed by me and considered in my medical decision making (see chart for details).   Ms. Mariann LasterBowden presented with cough and was found to have hypoxia around 91% on room air but symptoms of possible pneumonia. She's been reporting symptoms for 5 days with fever.  Chest x-ray consistent with left upper lobe pneumonia. She's also has tachycardia and elevated white blood cell count, I am going to place on the sepsis pathway although she's had no hypotension. I'm awaiting lactate for further fluid resuscitation, she is receiving 2 L to start.  Patient stable to 96% on 2 L nasal cannula oxygen supplementation. We discussed her findings. She has no allergies. She's not recently been hospitalized. I'm covering her with antiemetics of Rocephin and azithromycin for community acquired  pneumonia.  Lactate, troponin added on and pending at time of hospitalist consultation.  CONSULTATIONS:   Hospitalist for admission.   Patient / Family / Caregiver informed of clinical course, medical decision-making process, and agree with plan.   ___________________________________________   FINAL CLINICAL IMPRESSION(S) / ED DIAGNOSES   Final diagnoses:  Pneumonia of left upper lobe due to infectious organism [facility](HCC)              Note: This dictation was prepared with Dragon dictation. Any transcriptional errors that result from this process are unintentional    Governor Rooksebecca Sariah Henkin, MD 02/29/16 1436

## 2016-02-29 NOTE — ED Triage Notes (Signed)
Pt c/o cough with congestion and SOB with left rib pain for the past 5 days.. Pt has audible congestive breathe sounds in triage..Marland Kitchen

## 2016-03-01 LAB — CBC
HEMATOCRIT: 38.1 % (ref 35.0–47.0)
Hemoglobin: 13.2 g/dL (ref 12.0–16.0)
MCH: 29.8 pg (ref 26.0–34.0)
MCHC: 34.6 g/dL (ref 32.0–36.0)
MCV: 86.2 fL (ref 80.0–100.0)
Platelets: 228 10*3/uL (ref 150–440)
RBC: 4.42 MIL/uL (ref 3.80–5.20)
RDW: 13.7 % (ref 11.5–14.5)
WBC: 15.1 10*3/uL — AB (ref 3.6–11.0)

## 2016-03-01 LAB — BASIC METABOLIC PANEL
Anion gap: 7 (ref 5–15)
BUN: 20 mg/dL (ref 6–20)
CHLORIDE: 104 mmol/L (ref 101–111)
CO2: 25 mmol/L (ref 22–32)
Calcium: 8 mg/dL — ABNORMAL LOW (ref 8.9–10.3)
Creatinine, Ser: 0.89 mg/dL (ref 0.44–1.00)
GFR calc Af Amer: >60 mL/min (ref >60)
GFR calc non Af Amer: >60 mL/min (ref >60)
Glucose, Bld: 137 mg/dL — ABNORMAL HIGH (ref 65–99)
POTASSIUM: 3.4 mmol/L — AB (ref 3.5–5.1)
SODIUM: 136 mmol/L (ref 135–145)

## 2016-03-01 LAB — LACTIC ACID, PLASMA: Lactic Acid, Venous: 1.2 mmol/L (ref 0.5–1.9)

## 2016-03-01 MED ORDER — AZITHROMYCIN 500 MG PO TABS
500.0000 mg | ORAL_TABLET | Freq: Every day | ORAL | Status: DC
  Administered 2016-03-01 – 2016-03-03 (×3): 500 mg via ORAL
  Filled 2016-03-01 (×3): qty 1

## 2016-03-01 MED ORDER — ZOLPIDEM TARTRATE 5 MG PO TABS
2.5000 mg | ORAL_TABLET | Freq: Every evening | ORAL | Status: DC | PRN
  Filled 2016-03-01: qty 1

## 2016-03-01 NOTE — Care Management (Signed)
Admitted to [facility] with the diagnosis of sepsis. Lives with 43 year old son. Mother is Theresia MajorsBonnie Proctor [phone]). Dr. Maryellen PileEason is listed at primary care physician. "I hadn't seen him in years." Sees psych every month. Takes care of all basic activities of daily living herself, can drive, but has no car. No falls. Good appetite. Mother will transport. Gwenette GreetBrenda S Wojciech Willetts RN MSN CCM Care Management

## 2016-03-01 NOTE — Care Management Important Message (Signed)
Important Message  Patient Details  Name: Andrea Kane MRN: [id number] Date of Birth: 1970/09/24   Medicare Important Message Given:  Yes    Gwenette GreetBrenda S Pilot Prindle, RN 03/01/2016, 9:39 AM

## 2016-03-01 NOTE — Progress Notes (Signed)
[facility] - [place] at [facility]   PATIENT NAME: Andrea Kane    MR#:  [id number]  DATE OF BIRTH:  December 25, 1970  SUBJECTIVE:  CHIEF COMPLAINT:   Chief Complaint  Patient presents with  . Shortness of Breath  feels somewhat better although still quite SOB REVIEW OF SYSTEMS:  Review of Systems  Constitutional: Negative for chills, fever and weight loss.  HENT: Negative for nosebleeds and sore throat.   Eyes: Negative for blurred vision.  Respiratory: Positive for cough and shortness of breath.   Cardiovascular: Negative for chest pain, orthopnea, leg swelling and PND.  Gastrointestinal: Negative for abdominal pain, constipation, diarrhea, heartburn, nausea and vomiting.  Genitourinary: Negative for dysuria and urgency.  Musculoskeletal: Negative for back pain.  Skin: Negative for rash.  Neurological: Negative for dizziness, speech change, focal weakness and headaches.  Endo/Heme/Allergies: Does not bruise/bleed easily.  Psychiatric/Behavioral: Negative for depression.   DRUG ALLERGIES:  No Known Allergies VITALS:  Blood pressure 117/75, pulse 92, temperature 98.7 F (37.1 C), resp. rate 18, height 5\' 5"  (1.651 m), weight 75.9 kg (167 lb 6.4 oz), last menstrual period 01/24/2016, SpO2 96 %. PHYSICAL EXAMINATION:  Physical Exam  Constitutional: She is oriented to person, place, and time and well-developed, well-nourished, and in no distress.  HENT:  Head: Normocephalic and atraumatic.  Eyes: Conjunctivae and EOM are normal. Pupils are equal, round, and reactive to light.  Neck: Normal range of motion. Neck supple. No tracheal deviation present. No thyromegaly present.  Cardiovascular: Normal rate, regular rhythm and normal heart sounds.   Pulmonary/Chest: Effort normal and breath sounds normal. No respiratory distress. She has no wheezes. She exhibits no tenderness.  Abdominal: Soft. Bowel sounds are normal. She exhibits no distension. There is no tenderness.   Musculoskeletal: Normal range of motion.  Neurological: She is alert and oriented to person, place, and time. No cranial nerve deficit.  Skin: Skin is warm and dry. No rash noted.  Psychiatric: Mood and affect normal.   LABORATORY PANEL:   CBC  Recent Labs Lab 03/01/16 0415  WBC 15.1*  HGB 13.2  HCT 38.1  PLT 228   ------------------------------------------------------------------------------------------------------------------ Chemistries   Recent Labs Lab 03/01/16 0415  NA 136  K 3.4*  CL 104  CO2 25  GLUCOSE 137*  BUN 20  CREATININE 0.89  CALCIUM 8.0*   RADIOLOGY:  No results found. ASSESSMENT AND PLAN:  Andrea Kane  is a 45 y.o. female with a known history of COPD who quit smoking a few years ago. The emergency room with fever of 102, left-sided pleuritic chest pain and cough productive of phlegm. Workup in the emergency showed patient has left middle and lower lobe dense pneumonia.   1. Sepsis present on admission due to left middle lobe lower lobe dense consolidation/community-acquired pneumonia -Patient presented with fever 102, tachycardia, tachypnea, leukocytosis and positive chest x-ray, lactic acidosis - continue IV Rocephin and Zithromax -Follow blood cultures and white count -Incentive spirometer  2. History of COPD -No wheezing sats stable -Weaned to room air as oxygen stay stable. No indication for IV steroids. -Continue inhalers and breathing treatment as needed  3. Acute renal failure secondary to dehydration in the setting of pneumonia with mild hyponatremia - resolved with IV fluids, encourage oral fluids  4. Hypokalemia replete and recheck  5. DVT prophylaxis subcutaneous Lovenox     All the records are reviewed and case discussed with Care Management/Social Worker. Management plans discussed with the patient, family and they are  in agreement.  CODE STATUS: FULL CODE  TOTAL TIME TAKING CARE OF THIS PATIENT: 35 minutes.   More  than 50% of the time was spent in counseling/coordination of care: YES  POSSIBLE D/C IN 1-2 DAYS, DEPENDING ON CLINICAL CONDITION.   Delfino LovettVipul Zayan Delvecchio M.D on 03/01/2016 at 2:46 PM  Between 7am to 6pm - Pager - [phone]  After 6pm go to [email or web address] - Social research officer, governmentpassword EPAS [facility]  [facility] [place] [facility]  Office  [phone]  CC: Primary care physician; Derwood KaplanEason,  Ernest B, MD  Note: This dictation was prepared with Dragon dictation along with smaller phrase technology. Any transcriptional errors that result from this process are unintentional.

## 2016-03-02 NOTE — Progress Notes (Signed)
[facility] - [facility]   PATIENT NAME: Andrea Kane    MR#:  [id number]  DATE OF BIRTH:  [birth year]  SUBJECTIVE:  CHIEF COMPLAINT:   Chief Complaint  Patient presents with  . Shortness of Breath  feels somewhat better although still quite SOB . No high grade fever REVIEW OF SYSTEMS:  Review of Systems  Constitutional: Negative for chills, fever and weight loss.  HENT: Negative for nosebleeds and sore throat.   Eyes: Negative for blurred vision.  Respiratory: Positive for cough and shortness of breath.   Cardiovascular: Negative for chest pain, orthopnea, leg swelling and PND.  Gastrointestinal: Negative for abdominal pain, constipation, diarrhea, heartburn, nausea and vomiting.  Genitourinary: Negative for dysuria and urgency.  Musculoskeletal: Negative for back pain.  Skin: Negative for rash.  Neurological: Negative for dizziness, speech change, focal weakness and headaches.  Endo/Heme/Allergies: Does not bruise/bleed easily.  Psychiatric/Behavioral: Negative for depression.   DRUG ALLERGIES:  No Known Allergies VITALS:  Blood pressure 122/85, pulse 89, temperature 98.3 F (36.8 C), temperature source Oral, resp. rate 20, height 5\' 5"  (1.651 m), weight 75.9 kg (167 lb 6.4 oz), last menstrual period 01/24/2016, SpO2 93 %. PHYSICAL EXAMINATION:  Physical Exam  Constitutional: She is oriented to person, place, and time and well-developed, well-nourished, and in no distress.  HENT:  Head: Normocephalic and atraumatic.  Eyes: Conjunctivae and EOM are normal. Pupils are equal, round, and reactive to light.  Neck: Normal range of motion. Neck supple. No tracheal deviation present. No thyromegaly present.  Cardiovascular: Normal rate, regular rhythm and normal heart sounds.   Pulmonary/Chest: Effort normal and breath sounds normal. No respiratory distress. She has no wheezes. She exhibits no tenderness.  Abdominal: Soft. Bowel sounds are normal. She  exhibits no distension. There is no tenderness.  Musculoskeletal: Normal range of motion.  Neurological: She is alert and oriented to person, place, and time. No cranial nerve deficit.  Skin: Skin is warm and dry. No rash noted.  Psychiatric: Mood and affect normal.   LABORATORY PANEL:   CBC  Recent Labs Lab 03/01/16 0415  WBC 15.1*  HGB 13.2  HCT 38.1  PLT 228   ------------------------------------------------------------------------------------------------------------------ Chemistries   Recent Labs Lab 03/01/16 0415  NA 136  K 3.4*  CL 104  CO2 25  GLUCOSE 137*  BUN 20  CREATININE 0.89  CALCIUM 8.0*   RADIOLOGY:  No results found. ASSESSMENT AND PLAN:  Andrea Kane  is a 45 y.o. female with a known history of COPD who quit smoking a few years ago. The emergency room with fever of 102, left-sided pleuritic chest pain and cough productive of phlegm. Workup in the emergency showed patient has left middle and lower lobe dense pneumonia.   1. Sepsis present on admission due to left middle lobe lower lobe dense consolidation/community-acquired pneumonia -Patient presented with fever 102, tachycardia, tachypnea, leukocytosis and positive chest x-ray, lactic acidosis - continue IV Rocephin and Zithromax -negative blood cultures and white count improving -Incentive spirometer  2. History of COPD -No wheezing sats stable -Weaned to room air as oxygen stay stable. No indication for IV steroids. -Continue inhalers and breathing treatment as needed  3. Acute renal failure secondary to dehydration in the setting of pneumonia with mild hyponatremia - resolved with IV fluids, encourage oral fluids  4. Hypokalemia replete and recheck  5. DVT prophylaxis subcutaneous Lovenox     All the records are reviewed and case discussed with Care Management/Social Worker. Management  plans discussed with the patient, family and they are in agreement.  CODE STATUS: FULL  CODE  TOTAL TIME TAKING CARE OF THIS PATIENT: 35 minutes.   More than 50% of the time was spent in counseling/coordination of care: YES  POSSIBLE D/C IN 1-2 DAYS, DEPENDING ON CLINICAL CONDITION.   Andrea Kane M.D on 03/02/2016 at 4:12 PM  Between 7am to 6pm - Pager - [phone]  After 6pm go to [email or web address] - Social research officer, governmentpassword [email or web address]  [facility]  Office  [phone]  CC: Primary care physician; Derwood KaplanEason,  Ernest B, MD  Note: This dictation was prepared with Dragon dictation along with smaller phrase technology. Any transcriptional errors that result from this process are unintentional.

## 2016-03-03 ENCOUNTER — Inpatient Hospital Stay: Payer: Medicare Other

## 2016-03-03 MED ORDER — ALBUTEROL SULFATE (2.5 MG/3ML) 0.083% IN NEBU
2.5000 mg | INHALATION_SOLUTION | Freq: Four times a day (QID) | RESPIRATORY_TRACT | Status: DC
  Administered 2016-03-03 – 2016-03-04 (×5): 2.5 mg via RESPIRATORY_TRACT
  Filled 2016-03-03 (×5): qty 3

## 2016-03-03 MED ORDER — ACETYLCYSTEINE 20 % IN SOLN
4.0000 mL | Freq: Two times a day (BID) | RESPIRATORY_TRACT | Status: DC
  Administered 2016-03-03 (×2): 4 mL via RESPIRATORY_TRACT
  Filled 2016-03-03 (×2): qty 4

## 2016-03-03 NOTE — Progress Notes (Signed)
[facility] - [place] [facility]   PATIENT NAME: Andrea Kane    MR#:  [id number]  DATE OF BIRTH:  1970-07-28  SUBJECTIVE:  feels somewhat better although still quite SOB . No high grade fever REVIEW OF SYSTEMS:  Review of Systems  Constitutional: Negative for chills, fever and weight loss.  HENT: Negative for nosebleeds and sore throat.   Eyes: Negative for blurred vision.  Respiratory: Positive for cough and shortness of breath.   Cardiovascular: Negative for chest pain, orthopnea, leg swelling and PND.  Gastrointestinal: Negative for abdominal pain, constipation, diarrhea, heartburn, nausea and vomiting.  Genitourinary: Negative for dysuria and urgency.  Musculoskeletal: Negative for back pain.  Skin: Negative for rash.  Neurological: Negative for dizziness, speech change, focal weakness and headaches.  Endo/Heme/Allergies: Does not bruise/bleed easily.  Psychiatric/Behavioral: Negative for depression.   DRUG ALLERGIES:  No Known Allergies VITALS:  Blood pressure 112/60, pulse 90, temperature 98.2 F (36.8 C), temperature source Oral, resp. rate 20, height 5\' 5"  (1.651 m), weight 75.9 kg (167 lb 6.4 oz), last menstrual period 01/24/2016, SpO2 93 %. PHYSICAL EXAMINATION:  Physical Exam  Constitutional: She is oriented to person, place, and time and well-developed, well-nourished, and in no distress.  HENT:  Head: Normocephalic and atraumatic.  Eyes: Conjunctivae and EOM are normal. Pupils are equal, round, and reactive to light.  Neck: Normal range of motion. Neck supple. No tracheal deviation present. No thyromegaly present.  Cardiovascular: Normal rate, regular rhythm and normal heart sounds.   Pulmonary/Chest: Effort normal and breath sounds normal. No respiratory distress. She has no wheezes. She exhibits no tenderness.  Abdominal: Soft. Bowel sounds are normal. She exhibits no distension. There is no tenderness.  Musculoskeletal: Normal range of  motion.  Neurological: She is alert and oriented to person, place, and time. No cranial nerve deficit.  Skin: Skin is warm and dry. No rash noted.  Psychiatric: Mood and affect normal.   LABORATORY PANEL:   CBC  Recent Labs Lab 03/01/16 0415  WBC 15.1*  HGB 13.2  HCT 38.1  PLT 228   ------------------------------------------------------------------------------------------------------------------ Chemistries   Recent Labs Lab 03/01/16 0415  NA 136  K 3.4*  CL 104  CO2 25  GLUCOSE 137*  BUN 20  CREATININE 0.89  CALCIUM 8.0*   RADIOLOGY:  No results found. ASSESSMENT AND PLAN:  Andrea Kane  is a 45 y.o. female with a known history of COPD who quit smoking a few years ago. The emergency room with fever of 102, left-sided pleuritic chest pain and cough productive of phlegm. Workup in the emergency showed patient has left middle and lower lobe dense pneumonia.   1. Sepsis present on admission due to left middle lobe lower lobe dense consolidation/community-acquired pneumonia -Patient presented with fever 102, tachycardia, tachypnea, leukocytosis and positive chest x-ray, lactic acidosis - continue IV Rocephin and Zithromax -negative blood cultures and white count improving -Incentive spirometer -CXR today -Percussion Chest therapy and mucomyst nebs--spoke with RT  2. History of COPD -No wheezing sats stable -Weaned to room air as oxygen stay stable. No indication for IV steroids. -Continue inhalers and breathing treatment as needed  3. Acute renal failure secondary to dehydration in the setting of pneumonia with mild hyponatremia - resolved with IV fluids, encourage oral fluids  4. Hypokalemia replete and recheck  5. DVT prophylaxis subcutaneous Lovenox     All the records are reviewed and case discussed with Care Management/Social Worker. Management plans discussed with the patient, family  and they are in agreement.  CODE STATUS: FULL CODE  TOTAL TIME  TAKING CARE OF THIS PATIENT: 35 minutes.   More than 50% of the time was spent in counseling/coordination of care: YES  POSSIBLE D/C IN 1-2 DAYS, DEPENDING ON CLINICAL CONDITION.   Kainat Pizana M.D on 03/03/2016 at 1:52 PM  Between 7am to 6pm - Pager - [phone]  After 6pm go to [email or web address] - Social research officer, governmentpassword [email or web address]  [facility] [place] [facility]  Office  [phone]  CC: Primary care physician; Derwood KaplanEason,  Ernest B, MD  Note: This dictation was prepared with Dragon dictation along with smaller phrase technology. Any transcriptional errors that result from this process are unintentional.

## 2016-03-04 MED ORDER — BENZONATATE 100 MG PO CAPS
100.0000 mg | ORAL_CAPSULE | Freq: Three times a day (TID) | ORAL | Status: DC
  Administered 2016-03-04: 11:00:00 100 mg via ORAL
  Filled 2016-03-04: qty 1

## 2016-03-04 MED ORDER — LIDOCAINE 5 % EX PTCH: 1.0000 | MEDICATED_PATCH | CUTANEOUS | 0 refills | Status: DC

## 2016-03-04 MED ORDER — AZITHROMYCIN 500 MG PO TABS: 500.0000 mg | ORAL_TABLET | Freq: Every day | ORAL | 0 refills | Status: DC

## 2016-03-04 MED ORDER — IPRATROPIUM-ALBUTEROL 20-100 MCG/ACT IN AERS
1.0000 | INHALATION_SPRAY | Freq: Four times a day (QID) | RESPIRATORY_TRACT | Status: DC | PRN
  Filled 2016-03-04: qty 4

## 2016-03-04 MED ORDER — TRAMADOL HCL 50 MG PO TABS: 50.0000 mg | ORAL_TABLET | Freq: Four times a day (QID) | ORAL | 0 refills | Status: DC | PRN

## 2016-03-04 MED ORDER — CEFUROXIME AXETIL 500 MG PO TABS: 500.0000 mg | ORAL_TABLET | Freq: Two times a day (BID) | ORAL | 0 refills | Status: DC

## 2016-03-04 MED ORDER — IPRATROPIUM-ALBUTEROL 20-100 MCG/ACT IN AERS: 1.0000 | INHALATION_SPRAY | Freq: Four times a day (QID) | RESPIRATORY_TRACT | 0 refills | Status: DC | PRN

## 2016-03-04 MED ORDER — CEFUROXIME AXETIL 500 MG PO TABS
500.0000 mg | ORAL_TABLET | Freq: Two times a day (BID) | ORAL | Status: DC
  Filled 2016-03-04: qty 1

## 2016-03-04 MED ORDER — BENZONATATE 100 MG PO CAPS: 100.0000 mg | ORAL_CAPSULE | Freq: Three times a day (TID) | ORAL | 0 refills | Status: DC

## 2016-03-04 NOTE — Discharge Summary (Signed)
[facility] - [place] [facility]   PATIENT NAME: Andrea Kane    MR#:  [id number]  DATE OF BIRTH:  1970/12/27  DATE OF ADMISSION:  02/29/2016 ADMITTING PHYSICIAN: Enedina FinnerSona Nija Koopman, MD  DATE OF DISCHARGE: 03/04/16  PRIMARY CARE PHYSICIAN: Derwood KaplanEason,  Ernest B, MD    ADMISSION DIAGNOSIS:  Pneumonia of left upper lobe due to infectious organism (HCC) [J18.1]  DISCHARGE DIAGNOSIS:  Sepsis due to left side Pneumonia _community acquired COPD Acute  SECONDARY DIAGNOSIS:   Past Medical History:  Diagnosis Date  . COPD (chronic obstructive pulmonary disease) (HCC)   . Ectopic pregnancy     HOSPITAL COURSE:   Andrea Kane a 45 y.o. femalewith a known history of COPD who quit smoking a few years ago. The emergency room with fever of 102, left-sided pleuritic chest pain and cough productive of phlegm. Workup in the emergency showed patient has left middle and lower lobe dense pneumonia.   1. Sepsis present on admission due to left middle lobe lower lobe dense consolidation/community-acquired pneumonia -Patient presented with fever 102, tachycardia, tachypnea, leukocytosis and positive chest x-ray,lactic acidosis - continue IV Rocephin and Zithromax---po ceftin and zithromax -negative blood cultures and white count improving -Incentive spirometer -CXR showed some improvement -Percussion Chest therapy and mucomyst nebs--spoke with RT  2. History of COPD -No wheezing sats stable -Weaned to room air as oxygen stay stable. No indication for IV steroids. -Continue inhalers and breathing treatment as needed  3. Acute renal failure secondary to dehydration in the setting of pneumonia with mild hyponatremia - resolved with IV fluids, encourage oral fluids  4. Hypokalemia replete and recheck  5. DVT prophylaxis subcutaneous Lovenox  Will assess for home oxygen need  D/c home CONSULTS OBTAINED:  Treatment Team:  Enedina FinnerSona Aryan Sparks, MD  DRUG ALLERGIES:  No  Known Allergies  DISCHARGE MEDICATIONS:   Current Discharge Medication List    START taking these medications   Details  azithromycin (ZITHROMAX) 500 MG tablet Take 1 tablet (500 mg total) by mouth daily at 6 PM. Qty: 3 tablet, Refills: 0    benzonatate (TESSALON) 100 MG capsule Take 1 capsule (100 mg total) by mouth 3 (three) times daily. Qty: 20 capsule, Refills: 0    cefUROXime (CEFTIN) 500 MG tablet Take 1 tablet (500 mg total) by mouth 2 (two) times daily with a meal. Qty: 12 tablet, Refills: 0    Ipratropium-Albuterol (COMBIVENT) 20-100 MCG/ACT AERS respimat Inhale 1 puff into the lungs every 6 (six) hours as needed for wheezing. Qty: 1 Inhaler, Refills: 0    lidocaine (LIDODERM) 5 % Place 1 patch onto the skin daily. Remove & Discard patch within 12 hours or as directed by MD Qty: 15 patch, Refills: 0    traMADol (ULTRAM) 50 MG tablet Take 1 tablet (50 mg total) by mouth every 6 (six) hours as needed for moderate pain. Qty: 30 tablet, Refills: 0      CONTINUE these medications which have NOT CHANGED   Details  SUBOXONE 8-2 MG FILM Place 1 Film under the tongue 2 (two) times daily. Refills: 0        If you experience worsening of your admission symptoms, develop shortness of breath, life threatening emergency, suicidal or homicidal thoughts you must seek medical attention immediately by calling 911 or calling your MD immediately  if symptoms less severe.  You Must read complete instructions/literature along with all the possible adverse reactions/side effects for all the Medicines you take and that have been  prescribed to you. Take any new Medicines after you have completely understood and accept all the possible adverse reactions/side effects.   Please note  You were cared for by a hospitalist during your hospital stay. If you have any questions about your discharge medications or the care you received while you were in the hospital after you are discharged, you can  call the unit and asked to speak with the hospitalist on call if the hospitalist that took care of you is not available. Once you are discharged, your primary care physician will handle any further medical issues. Please note that NO REFILLS for any discharge medications will be authorized once you are discharged, as it is imperative that you return to your primary care physician (or establish a relationship with a primary care physician if you do not have one) for your aftercare needs so that they can reassess your need for medications and monitor your lab values. Today   SUBJECTIVE   Some cough and left sided cp. No fever  VITAL SIGNS:  Blood pressure 116/75, pulse 74, temperature 97.9 F (36.6 C), temperature source Oral, resp. rate 18, height 5\' 5"  (1.651 m), weight 75.9 kg (167 lb 6.4 oz), last menstrual period 01/24/2016, SpO2 97 %.  I/O:   Intake/Output Summary (Last 24 hours) at 03/04/16 0919 Last data filed at 03/03/16 1700  Gross per 24 hour  Intake              240 ml  Output                0 ml  Net              240 ml    PHYSICAL EXAMINATION:  GENERAL:  45 y.o.-year-old patient lying in the bed with no acute distress.  EYES: Pupils equal, round, reactive to light and accommodation. No scleral icterus. Extraocular muscles intact.  HEENT: Head atraumatic, normocephalic. Oropharynx and nasopharynx clear.  NECK:  Supple, no jugular venous distention. No thyroid enlargement, no tenderness.  LUNGS:decreased breath sounds left>right, no wheezing, rales,rhonchi or crepitation. No use of accessory muscles of respiration.  CARDIOVASCULAR: S1, S2 normal. No murmurs, rubs, or gallops.  ABDOMEN: Soft, non-tender, non-distended. Bowel sounds present. No organomegaly or mass.  EXTREMITIES: No pedal edema, cyanosis, or clubbing.  NEUROLOGIC: Cranial nerves II through XII are intact. Muscle strength 5/5 in all extremities. Sensation intact. Gait not checked.  PSYCHIATRIC:  patient is alert  and oriented x 3.  SKIN: No obvious rash, lesion, or ulcer.   DATA REVIEW:   CBC   Recent Labs Lab 03/01/16 0415  WBC 15.1*  HGB 13.2  HCT 38.1  PLT 228    Chemistries   Recent Labs Lab 03/01/16 0415  NA 136  K 3.4*  CL 104  CO2 25  GLUCOSE 137*  BUN 20  CREATININE 0.89  CALCIUM 8.0*    Microbiology Results   Recent Results (from the past 240 hour(s))  Culture, blood (routine x 2)     Status: None (Preliminary result)   Collection Time: 02/29/16  2:11 PM  Result Value Ref Range Status   Specimen Description BLOOD LEFT HAND  Final   Special Requests   Final    BOTTLES DRAWN AEROBIC AND ANAEROBIC AER ANA   Culture NO GROWTH 4 DAYS  Final   Report Status PENDING  Incomplete  Culture, blood (routine x 2)     Status: None (Preliminary result)   Collection Time: 02/29/16  2:11 PM  Result Value Ref Range Status   Specimen Description BLOOD RIGHT FA  Final   Special Requests   Final    BOTTLES DRAWN AEROBIC AND ANAEROBIC AER 12ML ANA 11ML   Culture NO GROWTH 4 DAYS  Final   Report Status PENDING  Incomplete    RADIOLOGY:  Dg Chest 2 View  Result Date: 03/03/2016 CLINICAL DATA:  Worsening cough and shortness of breath. EXAM: CHEST  2 VIEW COMPARISON:  February 29, 2016 FINDINGS: Persistent infiltrate in the right upper lobe. The size of the infiltrate is similar in the interval but a little less dense on the frontal view suggesting mild improvement. Small left effusion, new. No other changes. IMPRESSION: Persistent infiltrate in the left upper lobe with a new tiny left effusion. The size of the infiltrate is stable but the density is slightly decreased, suggesting mild improvement. Electronically Signed   By: Gerome Samavid  Williams III M.D   On: 03/03/2016 16:51     Management plans discussed with the patient, family and they are in agreement.  CODE STATUS:     Code Status Orders        Start     Ordered   02/29/16 1925  Full code  Continuous     02/29/16  1924    Code Status History    Date Active Date Inactive Code Status Order ID Comments User Context   This patient has a current code status but no historical code status.      TOTAL TIME TAKING CARE OF THIS PATIENT: 40 minutes.    Alysse Rathe M.D on 03/04/2016 at 9:19 AM  Between 7am to 6pm - Pager - [phone] After 6pm go to [email or web address] - password [email or web address] [email or web address]  [facility]  Office  [phone]  CC: Primary care physician; Derwood KaplanEason,  Ernest B, MD

## 2016-03-04 NOTE — Care Management Important Message (Signed)
Important Message  Patient Details  Name: Andrea Kane MRN: [id number] Date of Birth: 03-22-1971   Medicare Important Message Given:  Yes    Gwenette GreetBrenda S Amir Glaus, RN 03/04/2016, 10:21 AM

## 2016-03-04 NOTE — Discharge Instructions (Signed)
Incentive spirometer

## 2016-03-04 NOTE — Progress Notes (Signed)
Discussed discharge instructions and medications with pt. IV removed. All questions addressed. Pt transported home via car by her mother.

## 2016-03-05 LAB — CULTURE, BLOOD (ROUTINE X 2)
CULTURE: NO GROWTH
Culture: NO GROWTH

## 2016-03-05 NOTE — Progress Notes (Signed)
Chaplain was making his rounds and visited with pt in room 120. Pt was in good spirits and was awaiting discharge. Prayer was not offered.    03/04/16 1325  Clinical Encounter Type  Visited With Patient;Patient and family together  Visit Type Initial;Spiritual support  Referral From Nurse  Spiritual Encounters  Spiritual Needs Prayer

## 2016-10-08 ENCOUNTER — Emergency Department
Admission: EM | Admit: 2016-10-08 | Discharge: 2016-10-08 | Disposition: A | Discharge status: Home / Self Care | Payer: Medicare Other | Attending: Emergency Medicine | Admitting: Emergency Medicine

## 2016-10-08 ENCOUNTER — Emergency Department: Payer: Medicare Other

## 2016-10-08 DIAGNOSIS — Z87891 Personal history of nicotine dependence: Secondary | ICD-10-CM | POA: Insufficient documentation

## 2016-10-08 DIAGNOSIS — K529 Noninfective gastroenteritis and colitis, unspecified: Secondary | ICD-10-CM | POA: Diagnosis not present

## 2016-10-08 DIAGNOSIS — R1084 Generalized abdominal pain: Secondary | ICD-10-CM

## 2016-10-08 DIAGNOSIS — R112 Nausea with vomiting, unspecified: Secondary | ICD-10-CM

## 2016-10-08 DIAGNOSIS — J449 Chronic obstructive pulmonary disease, unspecified: Secondary | ICD-10-CM | POA: Diagnosis not present

## 2016-10-08 DIAGNOSIS — Z79899 Other long term (current) drug therapy: Secondary | ICD-10-CM | POA: Insufficient documentation

## 2016-10-08 LAB — COMPREHENSIVE METABOLIC PANEL
ALBUMIN: 4.2 g/dL (ref 3.5–5.0)
ALT: 16 U/L (ref 14–54)
AST: 18 U/L (ref 15–41)
Alkaline Phosphatase: 62 U/L (ref 38–126)
Anion gap: 9 (ref 5–15)
BILIRUBIN TOTAL: 0.7 mg/dL (ref 0.3–1.2)
BUN: 9 mg/dL (ref 6–20)
CO2: 22 mmol/L (ref 22–32)
CREATININE: 0.6 mg/dL (ref 0.44–1.00)
Calcium: 9.1 mg/dL (ref 8.9–10.3)
Chloride: 107 mmol/L (ref 101–111)
GFR calc Af Amer: >60 mL/min (ref >60)
GLUCOSE: 145 mg/dL — AB (ref 65–99)
POTASSIUM: 3.5 mmol/L (ref 3.5–5.1)
Sodium: 138 mmol/L (ref 135–145)
TOTAL PROTEIN: 7 g/dL (ref 6.5–8.1)

## 2016-10-08 LAB — URINALYSIS, COMPLETE (UACMP) WITH MICROSCOPIC
BACTERIA UA: NONE SEEN
Bilirubin Urine: NEGATIVE
Glucose, UA: NEGATIVE mg/dL
Hgb urine dipstick: NEGATIVE
Ketones, ur: 80 mg/dL — AB
Nitrite: NEGATIVE
PROTEIN: NEGATIVE mg/dL
SPECIFIC GRAVITY, URINE: 1.016 (ref 1.005–1.030)
pH: 5 (ref 5.0–8.0)

## 2016-10-08 LAB — CBC
HEMATOCRIT: 43.5 % (ref 35.0–47.0)
HEMOGLOBIN: 15 g/dL (ref 12.0–16.0)
MCH: 30.5 pg (ref 26.0–34.0)
MCHC: 34.5 g/dL (ref 32.0–36.0)
MCV: 88.3 fL (ref 80.0–100.0)
Platelets: 310 10*3/uL (ref 150–440)
RBC: 4.92 MIL/uL (ref 3.80–5.20)
RDW: 13 % (ref 11.5–14.5)
WBC: 15.4 10*3/uL — AB (ref 3.6–11.0)

## 2016-10-08 LAB — LIPASE, BLOOD: Lipase: 16 U/L (ref 11–51)

## 2016-10-08 LAB — TROPONIN I: Troponin I: <0.03 ng/mL (ref <0.03)

## 2016-10-08 LAB — PREGNANCY, URINE: Preg Test, Ur: NEGATIVE

## 2016-10-08 MED ORDER — ONDANSETRON HCL 4 MG/2ML IJ SOLN
4.0000 mg | Freq: Once | INTRAMUSCULAR | Status: AC
  Administered 2016-10-08: 4 mg via INTRAVENOUS
  Filled 2016-10-08: qty 2

## 2016-10-08 MED ORDER — IOPAMIDOL (ISOVUE-300) INJECTION 61%
100.0000 mL | Freq: Once | INTRAVENOUS | Status: AC | PRN
  Administered 2016-10-08: 100 mL via INTRAVENOUS

## 2016-10-08 MED ORDER — IPRATROPIUM-ALBUTEROL 0.5-2.5 (3) MG/3ML IN SOLN
3.0000 mL | Freq: Once | RESPIRATORY_TRACT | Status: AC
  Administered 2016-10-08: 3 mL via RESPIRATORY_TRACT
  Filled 2016-10-08: qty 3

## 2016-10-08 MED ORDER — METRONIDAZOLE 500 MG PO TABS
500.0000 mg | ORAL_TABLET | Freq: Once | ORAL | Status: AC
  Administered 2016-10-08: 500 mg via ORAL
  Filled 2016-10-08: qty 1

## 2016-10-08 MED ORDER — SODIUM CHLORIDE 0.9 % IV BOLUS (SEPSIS)
1000.0000 mL | Freq: Once | INTRAVENOUS | Status: AC
  Administered 2016-10-08: 1000 mL via INTRAVENOUS

## 2016-10-08 MED ORDER — CIPROFLOXACIN IN D5W 400 MG/200ML IV SOLN
400.0000 mg | Freq: Once | INTRAVENOUS | Status: AC
  Administered 2016-10-08: 400 mg via INTRAVENOUS
  Filled 2016-10-08: qty 200

## 2016-10-08 MED ORDER — ONDANSETRON HCL 4 MG/2ML IJ SOLN
4.0000 mg | Freq: Once | INTRAMUSCULAR | Status: AC | PRN
  Administered 2016-10-08: 4 mg via INTRAVENOUS
  Filled 2016-10-08: qty 2

## 2016-10-08 MED ORDER — LORAZEPAM 2 MG/ML IJ SOLN
1.0000 mg | Freq: Once | INTRAMUSCULAR | Status: AC
  Administered 2016-10-08: 1 mg via INTRAVENOUS
  Filled 2016-10-08: qty 1

## 2016-10-08 MED ORDER — ONDANSETRON 4 MG PO TBDP: 4.0000 mg | ORAL_TABLET | Freq: Three times a day (TID) | ORAL | 0 refills | Status: DC | PRN

## 2016-10-08 MED ORDER — CIPROFLOXACIN HCL 500 MG PO TABS: 500.0000 mg | ORAL_TABLET | Freq: Two times a day (BID) | ORAL | 0 refills | Status: AC

## 2016-10-08 MED ORDER — METRONIDAZOLE 500 MG PO TABS: 500.0000 mg | ORAL_TABLET | Freq: Two times a day (BID) | ORAL | 0 refills | Status: AC

## 2016-10-08 NOTE — ED Notes (Signed)
Pt states emesis x3 since in room. Reports pain 7/10

## 2016-10-08 NOTE — ED Provider Notes (Addendum)
[facility] Emergency Department Provider Note   ____________________________________________   First MD Initiated Contact with Patient 10/08/16 0250     (approximate)  I have reviewed the triage vital signs and the nursing notes.   HISTORY  Chief Complaint Abdominal Pain and Emesis    HPI Andrea Kane is a 46 y.o. female who comes into the hospital today with some vomiting and abdominal pain. The patient reports it started yesterday after she ate. She reports that it in her upper abdomen across the top of her belly. The patient reports that it aches and she feels nauseous. She denies having pain like this in the past. The patient reports that she took some Suboxone but it didn't seem to help. The patient rates her pain a 7 out of 10 in intensity. She states that she also had some vomiting but was more than she could count. The patient reports it was clear and yellow. She denies any blood in her emesis. The patient denies any diarrhea but feels like from it may come. She has some shortness of breath but no chest pain. The patient reports that she came in to the hospital today She can tolerate vomiting at home.   Past Medical History:  Diagnosis Date  . COPD (chronic obstructive pulmonary disease) (HCC)   . Ectopic pregnancy     Patient Active Problem List   Diagnosis Date Noted  . Sepsis (HCC) 02/29/2016    Past Surgical History:  Procedure Laterality Date  . BACK SURGERY    . OOPHORECTOMY Left     Prior to Admission medications   Medication Sig Start Date End Date Taking? Authorizing Provider  SUBOXONE 8-2 MG FILM Place 1 Film under the tongue 2 (two) times daily. 02/06/16  Yes [provider]  benzonatate (TESSALON) 100 MG capsule Take 1 capsule (100 mg total) by mouth 3 (three) times daily. Patient not taking: Reported on 10/08/2016 03/04/16   Enedina FinnerPatel, Sona, MD  ciprofloxacin (CIPRO) 500 MG tablet Take 1 tablet (500 mg total) by mouth 2  (two) times daily. 10/08/16 10/15/16  Rebecka ApleyWebster, Nasteho Glantz P, MD  Ipratropium-Albuterol (COMBIVENT) 20-100 MCG/ACT AERS respimat Inhale 1 puff into the lungs every 6 (six) hours as needed for wheezing. Patient not taking: Reported on 10/08/2016 03/04/16   Enedina FinnerPatel, Sona, MD  lidocaine (LIDODERM) 5 % Place 1 patch onto the skin daily. Remove & Discard patch within 12 hours or as directed by MD Patient not taking: Reported on 10/08/2016 03/04/16   Enedina FinnerPatel, Sona, MD  metroNIDAZOLE (FLAGYL) 500 MG tablet Take 1 tablet (500 mg total) by mouth 2 (two) times daily. 10/08/16 10/15/16  Rebecka ApleyWebster, Onur Mori P, MD  ondansetron (ZOFRAN ODT) 4 MG disintegrating tablet Take 1 tablet (4 mg total) by mouth every 8 (eight) hours as needed for nausea or vomiting. 10/08/16   Rebecka ApleyWebster, Sussie Minor P, MD  traMADol (ULTRAM) 50 MG tablet Take 1 tablet (50 mg total) by mouth every 6 (six) hours as needed for moderate pain. Patient not taking: Reported on 10/08/2016 03/04/16   Enedina FinnerPatel, Sona, MD    Allergies Patient has no known allergies.  No family history on file.  Social History Social History  Substance Use Topics  . Smoking status: Former Games developermoker  . Smokeless tobacco: Never Used  . Alcohol use No    Review of Systems  Constitutional: No fever/chills Eyes: No visual changes. ENT: No sore throat. Cardiovascular: Denies chest pain. Respiratory: Denies shortness of breath. Gastrointestinal:  abdominal pain, nausea,  vomiting.  No diarrhea.  No constipation. Genitourinary: Negative for dysuria. Musculoskeletal: Negative for back pain. Skin: Negative for rash. Neurological: Negative for headaches, focal weakness or numbness.   ____________________________________________   PHYSICAL EXAM:  VITAL SIGNS: ED Triage Vitals [10/08/16 0237]  Enc Vitals Group     BP (!) 164/82     Pulse Rate (!) 50     Resp      Temp (!) 97.5 F (36.4 C)     Temp Source Oral     SpO2 98 %     Weight 175 lb (79.4 kg)     Height 5\' 2"  (1.575 m)      Head Circumference      Peak Flow      Pain Score 7     Pain Loc      Pain Edu?      Excl. in GC?     Constitutional: Alert and oriented. Well appearing and in moderate distress. Eyes: Conjunctivae are normal. PERRL. EOMI. Head: Atraumatic. Nose: No congestion/rhinnorhea. Mouth/Throat: Mucous membranes are moist.  Oropharynx non-erythematous. Cardiovascular: Normal rate, regular rhythm. Grossly normal heart sounds.  Good peripheral circulation. Respiratory: Normal respiratory effort.  No retractions. Expiratory wheezes in all lung fields. Gastrointestinal: Soft with mild tenderness in her upper abdomen. No distention. Positive bowel sounds Musculoskeletal: No lower extremity tenderness nor edema.  Neurologic:  Normal speech and language.  Skin:  Skin is warm, dry and intact.  Psychiatric: Mood and affect are normal.   ____________________________________________   LABS (all labs ordered are listed, but only abnormal results are displayed)  Labs Reviewed  URINALYSIS, COMPLETE (UACMP) WITH MICROSCOPIC - Abnormal; Notable for the following:       Result Value   Color, Urine YELLOW (*)    APPearance CLOUDY (*)    Ketones, ur 80 (*)    Leukocytes, UA SMALL (*)    Squamous Epithelial / LPF 6-30 (*)    All other components within normal limits  CBC - Abnormal; Notable for the following:    WBC 15.4 (*)    All other components within normal limits  COMPREHENSIVE METABOLIC PANEL - Abnormal; Notable for the following:    Glucose, Bld 145 (*)    All other components within normal limits  LIPASE, BLOOD  TROPONIN I  PREGNANCY, URINE   ____________________________________________  EKG  ED ECG REPORT I, Rebecka Apley, the attending physician, personally viewed and interpreted this ECG.   Date: 10/08/2016  EKG Time: 541  Rate: 76  Rhythm: normal sinus rhythm  Axis: normal  Intervals:none  ST&T Change:  none  ____________________________________________  RADIOLOGY  Ct Abdomen Pelvis W Contrast  Result Date: 10/08/2016 CLINICAL DATA:  Initial evaluation for acute abdominal pain with vomiting. EXAM: CT ABDOMEN AND PELVIS WITH CONTRAST TECHNIQUE: Multidetector CT imaging of the abdomen and pelvis was performed using the standard protocol following bolus administration of intravenous contrast. CONTRAST:  ISOVUE-300 IOPAMIDOL (ISOVUE-300) INJECTION 61% COMPARISON:  None. FINDINGS: Lower chest: Visualized lung bases are clear. Hepatobiliary: Liver demonstrates a normal contrast enhanced appearance. Gallbladder within normal limits. No biliary dilatation. Pancreas: Pancreas within normal limits. Spleen: Spleen within normal limits. Adrenals/Urinary Tract: Adrenal glands are normal. Kidneys equal in size with symmetric enhancement. No nephrolithiasis, hydronephrosis, or focal enhancing renal mass. No hydroureter. Bladder within normal limits. Stomach/Bowel: Stomach within normal limits. Appendix within normal limits. There is abnormal wall thickening with hazy inflammatory stranding about a loop of prominent small bowel within the left abdomen.  Loop of bowel E is somewhat prominent measuring up to 3 cm in diameter. Fecalization of enteric contents. No definite discrete transition point. Finding is indeterminate, but may reflect a focal enteritis. An early or partial obstructive process could also be considered. Small bowel within normal limits distally. Colon within normal limits. Vascular/Lymphatic: Normal intravascular enhancement seen throughout the intra-abdominal aorta and its branch vessels. No adenopathy. Reproductive: Uterus within normal limits. Probable small bilateral corpus luteal cysts noted. Ovaries otherwise unremarkable. Other: Small volume free fluid present within the pelvis, which may be reactive or physiologic. No free intraperitoneal air. Musculoskeletal: No acute osseus abnormality. No  worrisome lytic or blastic osseous lesions. Facet arthropathy noted within the lumbar spine. IMPRESSION: 1. Abnormal wall thickening with hazy inflammatory stranding about a prominent loop of small bowel within the left mid abdomen. Finding favored to reflect a focal enteritis. While no definite transition point is identified, a partial or early obstructive process could be considered in the correct clinical setting. 2. Small volume free fluid within the pelvis, which may be reactive and/or physiologic. Electronically Signed   By: Rise Mu M.D.   On: 10/08/2016 06:47    ____________________________________________   PROCEDURES  Procedure(s) performed: None  Procedures  Critical Care performed: No  ____________________________________________   INITIAL IMPRESSION / ASSESSMENT AND PLAN / ED COURSE  Pertinent labs & imaging results that were available during my care of the patient were reviewed by me and considered in my medical decision making (see chart for details).  This is a 46 year old female who comes into the hospital today with some abdominal pain and vomiting. The patient does take Suboxone but reports that she's never had this pain before. I will give the patient liter of normal saline as well as a dose of Ativan. The patient does have a white blood cell count of 15.4. I will add a troponin onto her labs and EKG and I will send the patient for CT scan of her abdomen.     The patient's blood work is unremarkable. I did perform a CT scan. She has some hazy inflammation and stranding about a prominent loop of small bowel in her left mid abdomen. The concern is for a focal enteritis. She could have an early obstructive process. The patient did receive some ciprofloxacin and Flagyl. I gave her Ativan as well as Zofran. She was able to drink without any vomiting in the emergency department. When she's complete with her medication I will discharge her to follow-up with her  primary care physician. He is drinking without any vomiting the concern for an obstructive process is lessened. I did instruct the patient though to return should she have any worsening symptoms or any other concerns. The patient did receive a DuoNeb treatment for her wheezes. ____________________________________________   FINAL CLINICAL IMPRESSION(S) / ED DIAGNOSES  Final diagnoses:  Non-intractable vomiting with nausea, unspecified vomiting type  Enteritis  Generalized abdominal pain      NEW MEDICATIONS STARTED DURING THIS VISIT:  New Prescriptions   CIPROFLOXACIN (CIPRO) 500 MG TABLET    Take 1 tablet (500 mg total) by mouth 2 (two) times daily.   METRONIDAZOLE (FLAGYL) 500 MG TABLET    Take 1 tablet (500 mg total) by mouth 2 (two) times daily.   ONDANSETRON (ZOFRAN ODT) 4 MG DISINTEGRATING TABLET    Take 1 tablet (4 mg total) by mouth every 8 (eight) hours as needed for nausea or vomiting.     Note:  This document was  prepared using Conservation officer, historic buildings and may include unintentional dictation errors.    Rebecka Apley, MD 10/08/16 0805    Rebecka Apley, MD 10/08/16 [phone]

## 2016-10-08 NOTE — ED Notes (Signed)
Pt in CT.

## 2016-10-08 NOTE — ED Triage Notes (Signed)
Per EMS: pt c/o N/V and upper abdominal pain. Pt denies diarrhea. Pt reports approx 10 emeses today. Pt reports she feels like she's in withdrawl

## 2016-10-08 NOTE — ED Notes (Signed)
Patient c/o burning and itching at IV site. IV is patent, flushed with 10cc of normal saline. Dr. Zenda AlpersWebster notified. Verbal order to slow down the rate.

## 2016-10-08 NOTE — Discharge Instructions (Signed)
Please follow-up with your primary care physician. Please return with any worsening symptoms or other conditions.

## 2016-10-08 NOTE — ED Notes (Signed)
Report to emma, rn.  

## 2017-07-04 ENCOUNTER — Encounter: Payer: Self-pay | Admitting: Family Medicine

## 2017-07-04 ENCOUNTER — Ambulatory Visit (INDEPENDENT_AMBULATORY_CARE_PROVIDER_SITE_OTHER): Payer: Medicare Other | Admitting: Family Medicine

## 2017-07-04 VITALS — BP 139/85 | HR 72 | Temp 98.6°F | Ht 62.4 in | Wt 158.0 lb

## 2017-07-04 DIAGNOSIS — Z23 Encounter for immunization: Secondary | ICD-10-CM

## 2017-07-04 DIAGNOSIS — J449 Chronic obstructive pulmonary disease, unspecified: Secondary | ICD-10-CM | POA: Diagnosis not present

## 2017-07-04 DIAGNOSIS — Z114 Encounter for screening for human immunodeficiency virus [HIV]: Secondary | ICD-10-CM | POA: Diagnosis not present

## 2017-07-04 DIAGNOSIS — J441 Chronic obstructive pulmonary disease with (acute) exacerbation: Secondary | ICD-10-CM | POA: Diagnosis not present

## 2017-07-04 DIAGNOSIS — F1110 Opioid abuse, uncomplicated: Secondary | ICD-10-CM | POA: Diagnosis not present

## 2017-07-04 DIAGNOSIS — G8929 Other chronic pain: Secondary | ICD-10-CM | POA: Diagnosis not present

## 2017-07-04 DIAGNOSIS — F329 Major depressive disorder, single episode, unspecified: Secondary | ICD-10-CM | POA: Insufficient documentation

## 2017-07-04 DIAGNOSIS — M545 Low back pain, unspecified: Secondary | ICD-10-CM | POA: Insufficient documentation

## 2017-07-04 DIAGNOSIS — F339 Major depressive disorder, recurrent, unspecified: Secondary | ICD-10-CM

## 2017-07-04 MED ORDER — BENZONATATE 100 MG PO CAPS: 100.0000 mg | ORAL_CAPSULE | Freq: Two times a day (BID) | ORAL | 0 refills | Status: DC | PRN

## 2017-07-04 MED ORDER — IPRATROPIUM-ALBUTEROL 20-100 MCG/ACT IN AERS: 1.0000 | INHALATION_SPRAY | Freq: Four times a day (QID) | RESPIRATORY_TRACT | 6 refills | Status: DC | PRN

## 2017-07-04 MED ORDER — PREDNISONE 10 MG PO TABS: ORAL_TABLET | ORAL | 0 refills | Status: DC

## 2017-07-04 MED ORDER — ALBUTEROL SULFATE (2.5 MG/3ML) 0.083% IN NEBU
2.5000 mg | INHALATION_SOLUTION | Freq: Once | RESPIRATORY_TRACT | Status: AC
  Administered 2017-07-04: 2.5 mg via RESPIRATORY_TRACT

## 2017-07-04 MED ORDER — AZITHROMYCIN 250 MG PO TABS: ORAL_TABLET | ORAL | 0 refills | Status: DC

## 2017-07-04 NOTE — Patient Instructions (Signed)
Tdap Vaccine (Tetanus, Diphtheria and Pertussis): What You Need to Know 1. Why get vaccinated? Tetanus, diphtheria and pertussis are very serious diseases. Tdap vaccine can protect us from these diseases. And, Tdap vaccine given to pregnant women can protect newborn babies against pertussis. TETANUS (Lockjaw) is rare in the [place] today. It causes painful muscle tightening and stiffness, usually all over the body.  It can lead to tightening of muscles in the head and neck so you can't open your mouth, swallow, or sometimes even breathe. Tetanus kills about 1 out of 10 people who are infected even after receiving the best medical care.  DIPHTHERIA is also rare in the [place] today. It can cause a thick coating to form in the back of the throat.  It can lead to breathing problems, heart failure, paralysis, and death.  PERTUSSIS (Whooping Cough) causes severe coughing spells, which can cause difficulty breathing, vomiting and disturbed sleep.  It can also lead to weight loss, incontinence, and rib fractures. Up to 2 in 100 adolescents and 5 in 100 adults with pertussis are hospitalized or have complications, which could include pneumonia or death.  These diseases are caused by bacteria. Diphtheria and pertussis are spread from person to person through secretions from coughing or sneezing. Tetanus enters the body through cuts, scratches, or wounds. Before vaccines, as many as 200,000 cases of diphtheria, 200,000 cases of pertussis, and hundreds of cases of tetanus, were reported in the [place] each year. Since vaccination began, reports of cases for tetanus and diphtheria have dropped by about 99% and for pertussis by about 80%. 2. Tdap vaccine Tdap vaccine can protect adolescents and adults from tetanus, diphtheria, and pertussis. One dose of Tdap is routinely given at age 11 or 12. People who did not get Tdap at that age should get it as soon as possible. Tdap is especially  important for healthcare professionals and anyone having close contact with a baby younger than 12 months. Pregnant women should get a dose of Tdap during every pregnancy, to protect the newborn from pertussis. Infants are most at risk for severe, life-threatening complications from pertussis. Another vaccine, called Td, protects against tetanus and diphtheria, but not pertussis. A Td booster should be given every 10 years. Tdap may be given as one of these boosters if you have never gotten Tdap before. Tdap may also be given after a severe cut or burn to prevent tetanus infection. Your doctor or the person giving you the vaccine can give you more information. Tdap may safely be given at the same time as other vaccines. 3. Some people should not get this vaccine  A person who has ever had a life-threatening allergic reaction after a previous dose of any diphtheria, tetanus or pertussis containing vaccine, OR has a severe allergy to any part of this vaccine, should not get Tdap vaccine. Tell the person giving the vaccine about any severe allergies.  Anyone who had coma or long repeated seizures within 7 days after a childhood dose of DTP or DTaP, or a previous dose of Tdap, should not get Tdap, unless a cause other than the vaccine was found. They can still get Td.  Talk to your doctor if you: ? have seizures or another nervous system problem, ? had severe pain or swelling after any vaccine containing diphtheria, tetanus or pertussis, ? ever had a condition called Guillain-Barr Syndrome (GBS), ? aren't feeling well on the day the shot is scheduled. 4. Risks With any medicine, including   vaccines, there is a chance of side effects. These are usually mild and go away on their own. Serious reactions are also possible but are rare. Most people who get Tdap vaccine do not have any problems with it. Mild problems following Tdap: (Did not interfere with activities)  Pain where the shot was given (about  3 in 4 adolescents or 2 in 3 adults)  Redness or swelling where the shot was given (about 1 person in 5)  Mild fever of at least 100.4F (up to about 1 in 25 adolescents or 1 in 100 adults)  Headache (about 3 or 4 people in 10)  Tiredness (about 1 person in 3 or 4)  Nausea, vomiting, diarrhea, stomach ache (up to 1 in 4 adolescents or 1 in 10 adults)  Chills, sore joints (about 1 person in 10)  Body aches (about 1 person in 3 or 4)  Rash, swollen glands (uncommon)  Moderate problems following Tdap: (Interfered with activities, but did not require medical attention)  Pain where the shot was given (up to 1 in 5 or 6)  Redness or swelling where the shot was given (up to about 1 in 16 adolescents or 1 in 12 adults)  Fever over 102F (about 1 in 100 adolescents or 1 in 250 adults)  Headache (about 1 in 7 adolescents or 1 in 10 adults)  Nausea, vomiting, diarrhea, stomach ache (up to 1 or 3 people in 100)  Swelling of the entire arm where the shot was given (up to about 1 in 500).  Severe problems following Tdap: (Unable to perform usual activities; required medical attention)  Swelling, severe pain, bleeding and redness in the arm where the shot was given (rare).  Problems that could happen after any vaccine:  People sometimes faint after a medical procedure, including vaccination. Sitting or lying down for about 15 minutes can help prevent fainting, and injuries caused by a fall. Tell your doctor if you feel dizzy, or have vision changes or ringing in the ears.  Some people get severe pain in the shoulder and have difficulty moving the arm where a shot was given. This happens very rarely.  Any medication can cause a severe allergic reaction. Such reactions from a vaccine are very rare, estimated at fewer than 1 in a million doses, and would happen within a few minutes to a few hours after the vaccination. As with any medicine, there is a very remote chance of a vaccine  causing a serious injury or death. The safety of vaccines is always being monitored. For more information, visit: www.cdc.gov/vaccinesafety/ 5. What if there is a serious problem? What should I look for? Look for anything that concerns you, such as signs of a severe allergic reaction, very high fever, or unusual behavior. Signs of a severe allergic reaction can include hives, swelling of the face and throat, difficulty breathing, a fast heartbeat, dizziness, and weakness. These would usually start a few minutes to a few hours after the vaccination. What should I do?  If you think it is a severe allergic reaction or other emergency that can't wait, call 9-1-1 or get the person to the nearest hospital. Otherwise, call your doctor.  Afterward, the reaction should be reported to the Vaccine Adverse Event Reporting System (VAERS). Your doctor might file this report, or you can do it yourself through the VAERS web site at www.vaers.hhs.gov, or by calling 1-800-822-7967. ? VAERS does not give medical advice. 6. The National Vaccine Injury Compensation Program The National   Vaccine Injury Compensation Program (VICP) is a federal program that was created to compensate people who may have been injured by certain vaccines. Persons who believe they may have been injured by a vaccine can learn about the program and about filing a claim by calling 1-800-338-2382 or visiting the VICP website at www.hrsa.gov/vaccinecompensation. There is a time limit to file a claim for compensation. 7. How can I learn more?  Ask your doctor. He or she can give you the vaccine package insert or suggest other sources of information.  Call your local or state health department.  Contact the Centers for Disease Control and Prevention (CDC): ? Call 1-800-232-4636 (1-800-CDC-INFO) or ? Visit CDC's website at www.cdc.gov/vaccines CDC Tdap Vaccine VIS (05/25/13) This information is not intended to replace advice given to you by your  health care provider. Make sure you discuss any questions you have with your health care provider. Document Released: 09/17/2011 Document Revised: 12/07/2015 Document Reviewed: 12/07/2015 Elsevier Interactive Patient Education  2017 [employer]  

## 2017-07-04 NOTE — Progress Notes (Signed)
BP 139/85 (BP Location: Left Arm, Patient Position: Sitting, Cuff Size: Normal)   Pulse 72   Temp 98.6 F (37 C)   Ht 5' 2.4" (1.585 m)   Wt 158 lb (71.7 kg)   SpO2 94%   BMI 28.53 kg/m    Subjective:    Patient ID: Andrea Kane, female    DOB: 01/21/1971, 47 y.o.   MRN: [id number]  HPI: Andrea Kane is a 47 y.o. female  Chief Complaint  Patient presents with  . URI    cough and nasal congestion  . Establish Care   UPPER RESPIRATORY TRACT INFECTION Duration: couple of days Worst symptom: congestion and cough Fever: yes Cough: yes Shortness of breath: yes Wheezing: yes Chest pain: yes, with cough Chest tightness: yes Chest congestion: yes Nasal congestion: yes Runny nose: yes Post nasal drip: yes Sneezing: yes Sore throat: no Swollen glands: yes Sinus pressure: no Headache: yes Face pain: yes Toothache: no Ear pain: no  Ear pressure: no  Eyes red/itching:yes Eye drainage/crusting: no  Vomiting: no Rash: no Fatigue: yes Sick contacts: yes Strep contacts: no  Context: worse Recurrent sinusitis: no Relief with OTC cold/cough medications: no  Treatments attempted: cold/sinus and anti-histamine   DEPRESSION- follows with psychiatry. Doing well. No concerns. They handle her suboxone.  Mood status: controlled Satisfied with current treatment?: yes Symptom severity: moderate  Duration of current treatment : chronic Side effects: no Medication compliance: excellent compliance Depressed mood: no Anxious mood: no Anhedonia: no Significant weight loss or gain: no Insomnia: no  Fatigue: no Feelings of worthlessness or guilt: no Impaired concentration/indecisiveness: no Suicidal ideations: no Hopelessness: no Crying spells: no Depression screen PHQ 2/9 07/04/2017  Decreased Interest 0  Down, Depressed, Hopeless 0  PHQ - 2 Score 0     Active Ambulatory Problems    Diagnosis Date Noted  . COPD (chronic obstructive pulmonary disease) (HCC)  07/04/2017  . Chronic low back pain 07/04/2017  . Major depression 07/04/2017  . Opiate abuse, continuous (HCC) 07/04/2017   Resolved Ambulatory Problems    Diagnosis Date Noted  . Sepsis (HCC) 02/29/2016   Past Medical History:  Diagnosis Date  . COPD (chronic obstructive pulmonary disease) (HCC)   . Ectopic pregnancy    Past Surgical History:  Procedure Laterality Date  . BACK SURGERY     X3  . OOPHORECTOMY Left    Outpatient Encounter Medications as of 07/04/2017  Medication Sig  . buprenorphine-naloxone (SUBOXONE) 8-2 mg SUBL SL tablet Place 1 tablet under the tongue 2 (two) times daily.  . [DISCONTINUED] SUBOXONE 8-2 MG FILM Place 1 Film under the tongue 2 (two) times daily.  Marland Kitchen azithromycin (ZITHROMAX) 250 MG tablet 2 tabs today, 1 tab daily for 4 days  . benzonatate (TESSALON) 100 MG capsule Take 1 capsule (100 mg total) by mouth 2 (two) times daily as needed for cough.  . gabapentin (NEURONTIN) 300 MG capsule 300 mg 2 (two) times daily.  . hydrOXYzine (VISTARIL) 50 MG capsule Take 50 mg by mouth 4 (four) times daily.  . Ipratropium-Albuterol (COMBIVENT) 20-100 MCG/ACT AERS respimat Inhale 1 puff into the lungs every 6 (six) hours as needed for wheezing.  . predniSONE (DELTASONE) 10 MG tablet 6 tabs today and tomorrow, decrease by 1 every other day until gone  . traMADol (ULTRAM) 50 MG tablet Take 1 tablet (50 mg total) by mouth every 6 (six) hours as needed for moderate pain. (Patient not taking: Reported on 10/08/2016)  . traZODone (DESYREL)  50 MG tablet Take 100 mg by mouth at bedtime.  Marland Kitchen VIIBRYD 20 MG TABS TAKE ONE TABLET BY MOUTH EVERY EVENING AT SUPPER  . [DISCONTINUED] benzonatate (TESSALON) 100 MG capsule Take 1 capsule (100 mg total) by mouth 3 (three) times daily. (Patient not taking: Reported on 10/08/2016)  . [DISCONTINUED] Ipratropium-Albuterol (COMBIVENT) 20-100 MCG/ACT AERS respimat Inhale 1 puff into the lungs every 6 (six) hours as needed for wheezing. (Patient  not taking: Reported on 10/08/2016)  . [DISCONTINUED] lidocaine (LIDODERM) 5 % Place 1 patch onto the skin daily. Remove & Discard patch within 12 hours or as directed by MD (Patient not taking: Reported on 10/08/2016)  . [DISCONTINUED] ondansetron (ZOFRAN ODT) 4 MG disintegrating tablet Take 1 tablet (4 mg total) by mouth every 8 (eight) hours as needed for nausea or vomiting.  . [EXPIRED] albuterol (PROVENTIL) (2.5 MG/3ML) 0.083% nebulizer solution 2.5 mg    No facility-administered encounter medications on file as of 07/04/2017.    No Known Allergies  Social History   Socioeconomic History  . Marital status: Single    Spouse name: Not on file  . Number of children: Not on file  . Years of education: Not on file  . Highest education level: Not on file  Occupational History  . Not on file  Social Needs  . Financial resource strain: Not on file  . Food insecurity:    Worry: Not on file    Inability: Not on file  . Transportation needs:    Medical: Not on file    Non-medical: Not on file  Tobacco Use  . Smoking status: Former Games developer  . Smokeless tobacco: Never Used  Substance and Sexual Activity  . Alcohol use: No  . Drug use: Not Currently  . Sexual activity: Not Currently  Lifestyle  . Physical activity:    Days per week: Not on file    Minutes per session: Not on file  . Stress: Not on file  Relationships  . Social connections:    Talks on phone: Not on file    Gets together: Not on file    Attends religious service: Not on file    Active member of club or organization: Not on file    Attends meetings of clubs or organizations: Not on file    Relationship status: Not on file  . Intimate partner violence:    Fear of current or ex partner: Not on file    Emotionally abused: Not on file    Physically abused: Not on file    Forced sexual activity: Not on file  Other Topics Concern  . Not on file  Social History Narrative  . Not on file    Family History  Problem  Relation Age of Onset  . Diabetes Mother   . Cancer Mother        ovarian  . Hypertension Father   . COPD Sister   . Drug abuse Sister   . Cerebral palsy Son   . Alzheimer's disease Maternal Grandmother   . Cancer Maternal Grandmother   . Hypertension Paternal Grandfather   . Hyperlipidemia Paternal Grandfather   . Cancer Other        ovarian     Review of Systems  Constitutional: Negative.   HENT: Positive for congestion. Negative for dental problem, drooling, ear discharge, ear pain, facial swelling, hearing loss, mouth sores, nosebleeds, postnasal drip, rhinorrhea, sinus pressure, sinus pain, sneezing, sore throat, tinnitus, trouble swallowing and voice change.   Respiratory:  Positive for cough, chest tightness, shortness of breath and wheezing. Negative for apnea, choking and stridor.   Cardiovascular: Negative.   Psychiatric/Behavioral: Negative.     Per HPI unless specifically indicated above     Objective:    BP 139/85 (BP Location: Left Arm, Patient Position: Sitting, Cuff Size: Normal)   Pulse 72   Temp 98.6 F (37 C)   Ht 5' 2.4" (1.585 m)   Wt 158 lb (71.7 kg)   SpO2 94%   BMI 28.53 kg/m   Wt Readings from Last 3 Encounters:  07/04/17 158 lb (71.7 kg)  10/08/16 175 lb (79.4 kg)  02/29/16 167 lb 6.4 oz (75.9 kg)    Physical Exam  Constitutional: She is oriented to person, place, and time. She appears well-developed and well-nourished. No distress.  HENT:  Head: Normocephalic and atraumatic.  Right Ear: Hearing, tympanic membrane, external ear and ear canal normal.  Left Ear: Hearing, tympanic membrane, external ear and ear canal normal.  Nose: Nose normal.  Mouth/Throat: Uvula is midline, oropharynx is clear and moist and mucous membranes are normal. No oropharyngeal exudate.  Eyes: Pupils are equal, round, and reactive to light. Conjunctivae, EOM and lids are normal. Right eye exhibits no discharge. Left eye exhibits no discharge. No scleral icterus.    Neck: Normal range of motion. Neck supple. No JVD present. No tracheal deviation present. No thyromegaly present.  Cardiovascular: Normal rate, regular rhythm, normal heart sounds and intact distal pulses. Exam reveals no gallop and no friction rub.  No murmur heard. Pulmonary/Chest: Effort normal. No stridor. No respiratory distress. She has wheezes in the right upper field, the right middle field, the right lower field, the left upper field, the left middle field and the left lower field. She has no rales. She exhibits no tenderness.  Musculoskeletal: Normal range of motion.  Lymphadenopathy:    She has no cervical adenopathy.  Neurological: She is alert and oriented to person, place, and time.  Skin: Skin is warm, dry and intact. No rash noted. She is not diaphoretic. No erythema. No pallor.  Psychiatric: She has a normal mood and affect. Her speech is normal and behavior is normal. Judgment and thought content normal. Cognition and memory are normal.  Nursing note and vitals reviewed.   Results for orders placed or performed during the hospital encounter of 10/08/16  Urinalysis, Complete w Microscopic  Result Value Ref Range   Color, Urine YELLOW (A) YELLOW   APPearance CLOUDY (A) CLEAR   Specific Gravity, Urine 1.016 1.005 - 1.030   pH 5.0 5.0 - 8.0   Glucose, UA NEGATIVE NEGATIVE mg/dL   Hgb urine dipstick NEGATIVE NEGATIVE   Bilirubin Urine NEGATIVE NEGATIVE   Ketones, ur 80 (A) NEGATIVE mg/dL   Protein, ur NEGATIVE NEGATIVE mg/dL   Nitrite NEGATIVE NEGATIVE   Leukocytes, UA SMALL (A) NEGATIVE   RBC / HPF 0-5 0 - 5 RBC/hpf   WBC, UA 0-5 0 - 5 WBC/hpf   Bacteria, UA NONE SEEN NONE SEEN   Squamous Epithelial / LPF 6-30 (A) NONE SEEN   Mucus PRESENT    Amorphous Crystal PRESENT   CBC  Result Value Ref Range   WBC 15.4 (H) 3.6 - 11.0 K/uL   RBC 4.92 3.80 - 5.20 MIL/uL   Hemoglobin 15.0 12.0 - 16.0 g/dL   HCT 16.1 09.6 - 04.5 %   MCV 88.3 80.0 - 100.0 fL   MCH 30.5 26.0 -  34.0 pg   MCHC 34.5 32.0 -  36.0 g/dL   RDW 40.9 81.1 - 91.4 %   Platelets 310 150 - 440 K/uL  Comprehensive metabolic panel  Result Value Ref Range   Sodium 138 135 - 145 mmol/L   Potassium 3.5 3.5 - 5.1 mmol/L   Chloride 107 101 - 111 mmol/L   CO2 22 22 - 32 mmol/L   Glucose, Bld 145 (H) 65 - 99 mg/dL   BUN 9 6 - 20 mg/dL   Creatinine, Ser 7.82 0.44 - 1.00 mg/dL   Calcium 9.1 8.9 - 95.6 mg/dL   Total Protein 7.0 6.5 - 8.1 g/dL   Albumin 4.2 3.5 - 5.0 g/dL   AST 18 15 - 41 U/L   ALT 16 14 - 54 U/L   Alkaline Phosphatase 62 38 - 126 U/L   Total Bilirubin 0.7 0.3 - 1.2 mg/dL   GFR calc non Af Amer >60 >60 mL/min   GFR calc Af Amer >60 >60 mL/min   Anion gap 9 5 - 15  Lipase, blood  Result Value Ref Range   Lipase 16 11 - 51 U/L  Troponin I  Result Value Ref Range   Troponin I <0.03 <0.03 ng/mL  Pregnancy, urine  Result Value Ref Range   Preg Test, Ur NEGATIVE NEGATIVE      Assessment & Plan:   Problem List Items Addressed This Visit      Respiratory   COPD (chronic obstructive pulmonary disease) (HCC)    Currently exacerbating. Will treat as above, likely needs different preventative medicine, just on combivent now. Will need spiro.       Relevant Medications   albuterol (PROVENTIL) (2.5 MG/3ML) 0.083% nebulizer solution 2.5 mg (Completed)   predniSONE (DELTASONE) 10 MG tablet   azithromycin (ZITHROMAX) 250 MG tablet   benzonatate (TESSALON) 100 MG capsule   Ipratropium-Albuterol (COMBIVENT) 20-100 MCG/ACT AERS respimat     Other   Chronic low back pain    Under good control. Managed by psychiatry. Call with any concerns. Continue to monitor. Call with any concerns.       Relevant Medications   buprenorphine-naloxone (SUBOXONE) 8-2 mg SUBL SL tablet   predniSONE (DELTASONE) 10 MG tablet   Major depression    Under good control. Managed by psychiatry. Call with any concerns. Continue to monitor. Call with any concerns.       Relevant Medications   VIIBRYD  20 MG TABS   hydrOXYzine (VISTARIL) 50 MG capsule   traZODone (DESYREL) 50 MG tablet   Opiate abuse, continuous (HCC)    Under good control. Managed by psychiatry. Call with any concerns. Continue to monitor. Call with any concerns.        Other Visit Diagnoses    COPD exacerbation (HCC)    -  Primary   In acute exacerbation. Will treat with 12 day prednisone taper, albuterol, azithromycin and recheck in 2 weeks. Call if not getting better or getting worse.    Relevant Medications   albuterol (PROVENTIL) (2.5 MG/3ML) 0.083% nebulizer solution 2.5 mg (Completed)   predniSONE (DELTASONE) 10 MG tablet   azithromycin (ZITHROMAX) 250 MG tablet   benzonatate (TESSALON) 100 MG capsule   Ipratropium-Albuterol (COMBIVENT) 20-100 MCG/ACT AERS respimat   Screening for HIV (human immunodeficiency virus)       Labs to be drawn next visit with other physical labs.    Relevant Orders   Tdap vaccine greater than or equal to 7yo IM (Completed)   HIV antibody   Immunization due  Tdap given today.    Relevant Orders   Tdap vaccine greater than or equal to 7yo IM (Completed)       Follow up plan: Return 2 weeks, for Physical.

## 2017-07-05 ENCOUNTER — Telehealth: Payer: Self-pay | Admitting: Family Medicine

## 2017-07-05 ENCOUNTER — Encounter: Payer: Self-pay | Admitting: Family Medicine

## 2017-07-05 NOTE — Assessment & Plan Note (Signed)
Under good control. Managed by psychiatry. Call with any concerns. Continue to monitor. Call with any concerns.

## 2017-07-05 NOTE — Assessment & Plan Note (Signed)
Under good control. Managed by psychiatry. Call with any concerns. Continue to monitor. Call with any concerns.  

## 2017-07-05 NOTE — Telephone Encounter (Signed)
Needs new nebulizer machine form sent to [facility]

## 2017-07-05 NOTE — Assessment & Plan Note (Signed)
Currently exacerbating. Will treat as above, likely needs different preventative medicine, just on combivent now. Will need spiro.

## 2017-07-07 NOTE — Telephone Encounter (Signed)
Order written, will get signature and fax to [facility].

## 2017-07-09 DIAGNOSIS — J449 Chronic obstructive pulmonary disease, unspecified: Secondary | ICD-10-CM | POA: Diagnosis not present

## 2017-07-09 DIAGNOSIS — M545 Low back pain: Secondary | ICD-10-CM | POA: Diagnosis not present

## 2017-07-17 ENCOUNTER — Encounter: Payer: Medicare Other | Admitting: Family Medicine

## 2017-07-30 DIAGNOSIS — G8929 Other chronic pain: Secondary | ICD-10-CM | POA: Diagnosis not present

## 2017-08-08 DIAGNOSIS — J449 Chronic obstructive pulmonary disease, unspecified: Secondary | ICD-10-CM | POA: Diagnosis not present

## 2017-08-20 DIAGNOSIS — M545 Low back pain: Secondary | ICD-10-CM | POA: Diagnosis not present

## 2017-09-04 ENCOUNTER — Ambulatory Visit: Payer: Self-pay

## 2017-09-04 NOTE — Telephone Encounter (Signed)
Patient called in with c/o "difficulty breathing and coughing." She says "I have COPD and cough all the time. But, this has gotten worse. I start coughing when I am hot and at night. I can't lay down, because I am short of breath. I walk from one room to the next and get SOB. I checked my oxygen level earlier and it was 82%, I thought I was going to pass out. I took a breathing treatment and my level came up to 95%." As patient is talking, she has audible wheezing and rapid respirations. According to protocol, go to ED. She says "I don't want to do that. What appointments does Dr. Laural BenesJohnson have?" I advised her based on her breathing, wheezing, shortness of breath, rapid respirations, low oxygen levels, she will need to be evaluated in the ED. I advised they have the capability of handling her breathing difficulty more so than the office at this point and the fact that the office is closed, she really needs to go. She says "ok, I'll call and get me a ride." I offered to call 911, she says "no, I don't want that. I will go." Care advice given, patient verbalized understanding.   Reason for Disposition . [1] MODERATE difficulty breathing (e.g., speaks in phrases, SOB even at rest, pulse 100-120) AND [2] NEW-onset or WORSE than normal  Answer Assessment - Initial Assessment Questions 1. RESPIRATORY STATUS: "Describe your breathing?" (e.g., wheezing, shortness of breath, unable to speak, severe coughing)      Shortness of breath, coughing, wheezing 2. ONSET: "When did this breathing problem begin?"      Chronic, but has gotten worse 3. PATTERN "Does the difficult breathing come and go, or has it been constant since it started?"      Constant 4. SEVERITY: "How bad is your breathing?" (e.g., mild, moderate, severe)    - MILD: No SOB at rest, mild SOB with walking, speaks normally in sentences, can lay down, no retractions, pulse < 100.    - MODERATE: SOB at rest, SOB with minimal exertion and prefers to sit,  cannot lie down flat, speaks in phrases, mild retractions, audible wheezing, pulse 100-120.    - SEVERE: Very SOB at rest, speaks in single words, struggling to breathe, sitting hunched forward, retractions, pulse > 120      Moderate 5. RECURRENT SYMPTOM: "Have you had difficulty breathing before?" If so, ask: "When was the last time?" and "What happened that time?"      Yes 6. CARDIAC HISTORY: "Do you have any history of heart disease?" (e.g., heart attack, angina, bypass surgery, angioplasty)      NO 7. LUNG HISTORY: "Do you have any history of lung disease?"  (e.g., pulmonary embolus, asthma, emphysema)     COPD 8. CAUSE: "What do you think is causing the breathing problem?"      COPD  9. OTHER SYMPTOMS: "Do you have any other symptoms? (e.g., dizziness, runny nose, cough, chest pain, fever)    Cough, runny nose 10. PREGNANCY: "Is there any chance you are pregnant?" "When was your last menstrual period?"       No 11. TRAVEL: "Have you traveled out of the country in the last month?" (e.g., travel history, exposures)       No  Protocols used: BREATHING DIFFICULTY-A-AH

## 2017-09-05 DIAGNOSIS — R918 Other nonspecific abnormal finding of lung field: Secondary | ICD-10-CM | POA: Diagnosis not present

## 2017-09-05 DIAGNOSIS — J441 Chronic obstructive pulmonary disease with (acute) exacerbation: Secondary | ICD-10-CM | POA: Diagnosis not present

## 2017-09-05 DIAGNOSIS — R0902 Hypoxemia: Secondary | ICD-10-CM | POA: Diagnosis not present

## 2017-09-05 DIAGNOSIS — J9622 Acute and chronic respiratory failure with hypercapnia: Secondary | ICD-10-CM | POA: Diagnosis not present

## 2017-09-05 DIAGNOSIS — J9621 Acute and chronic respiratory failure with hypoxia: Secondary | ICD-10-CM | POA: Diagnosis not present

## 2017-09-05 DIAGNOSIS — R0602 Shortness of breath: Secondary | ICD-10-CM | POA: Diagnosis not present

## 2017-09-05 DIAGNOSIS — R0600 Dyspnea, unspecified: Secondary | ICD-10-CM | POA: Diagnosis not present

## 2017-09-05 DIAGNOSIS — J9691 Respiratory failure, unspecified with hypoxia: Secondary | ICD-10-CM | POA: Diagnosis not present

## 2017-09-05 DIAGNOSIS — J449 Chronic obstructive pulmonary disease, unspecified: Secondary | ICD-10-CM | POA: Diagnosis not present

## 2017-09-05 DIAGNOSIS — J45901 Unspecified asthma with (acute) exacerbation: Secondary | ICD-10-CM | POA: Diagnosis not present

## 2017-09-05 NOTE — Telephone Encounter (Signed)
Can we make sure she went somewhere (I don't see that she did) and if she didn't get her in ASAP

## 2017-09-05 NOTE — Telephone Encounter (Signed)
Spoke with pt to see if she went to the ED or urgent care. She stated that she wasn't going to the ED to sit for hours to get the same breathing treatment that she had at home. She stated that she would wait until she could see Dr Laural BenesJohnson Monday. I again advised her to go to the ED. Pt said ok and disconnected.

## 2017-09-05 NOTE — Telephone Encounter (Signed)
FYI

## 2017-09-05 NOTE — Telephone Encounter (Signed)
See message.

## 2017-09-06 DIAGNOSIS — J45909 Unspecified asthma, uncomplicated: Secondary | ICD-10-CM | POA: Insufficient documentation

## 2017-09-08 ENCOUNTER — Ambulatory Visit: Payer: Medicare Other | Admitting: Family Medicine

## 2017-09-10 ENCOUNTER — Telehealth: Payer: Self-pay | Admitting: Family Medicine

## 2017-09-10 NOTE — Telephone Encounter (Signed)
[facility] since 6/7 released 6/12 was admitted with COPD really exacerbated.  Recommended for chest xray within 4-6 weeks to make sure lungs doing ok.  She was on Bi-PAP during the stay.  Recommended also referral to pulmonologist also.  Patient was treated on antibiotic.  If you have any questions Dr Driscilla GrammesJennifer Goralski can be reached @ [phone]  Thanks

## 2017-09-11 NOTE — Telephone Encounter (Signed)
Can we please get her scheduled for a hospital follow up

## 2017-09-15 ENCOUNTER — Ambulatory Visit (INDEPENDENT_AMBULATORY_CARE_PROVIDER_SITE_OTHER): Payer: Medicare Other | Admitting: Family Medicine

## 2017-09-15 ENCOUNTER — Encounter: Payer: Self-pay | Admitting: Family Medicine

## 2017-09-15 VITALS — BP 111/75 | HR 70 | Temp 98.9°F | Wt 161.1 lb

## 2017-09-15 DIAGNOSIS — J449 Chronic obstructive pulmonary disease, unspecified: Secondary | ICD-10-CM

## 2017-09-15 DIAGNOSIS — Z114 Encounter for screening for human immunodeficiency virus [HIV]: Secondary | ICD-10-CM | POA: Diagnosis not present

## 2017-09-15 MED ORDER — TRELEGY ELLIPTA 100-62.5-25 MCG/INH IN AEPB: 1.0000 | INHALATION_SPRAY | Freq: Every day | RESPIRATORY_TRACT | 6 refills | Status: DC

## 2017-09-15 NOTE — Progress Notes (Signed)
BP 111/75 (BP Location: Left Arm, Patient Position: Sitting, Cuff Size: Normal)   Pulse 70   Temp 98.9 F (37.2 C)   Wt 161 lb 2 oz (73.1 kg)   SpO2 98%   BMI 29.09 kg/m    Subjective:    Patient ID: Andrea Kane, female    DOB: Jun 27, 1970, 47 y.o.   MRN: [id number]  HPI: Andrea Kane is a 47 y.o. female  Chief Complaint  Patient presents with  . Hospitalization Follow-up    COPD   Transition of Care Hospital Follow up.   Hospital/Facility: [facility] D/C Physician: Konrad DoloresJennifer Lynn Goralski, MD D/C Date: 09/10/17  Records Requested: 09/15/17  Records Received:  09/15/17 Records Reviewed:  09/15/17  Diagnoses on Discharge: COPD exacerbation, asthma  Date of interactive Contact within 48 hours of discharge: yes, by phone Lamont Snowball(Mary Kerr, RN)  Date of 7 day or 14 day face-to-face visit: 09/15/17 within 7 days  STOP taking these medications  black cohosh 540 mg Cap  COMBIVENT RESPIMAT 20-100 mcg/actuation Mist inhaler Generic drug: ipratropium-albuterol   START taking these medications  famotidine 20 MG tablet Commonly known as: PEPCID Take 1 tablet (20 mg total) by mouth Two (2) times a day.  fluticasone-umeclidin-vilanter 100-62.5-25 mcg inhaler Commonly known as: TRELEGY ELLIPTA Inhale 1 puff daily.  traZODone 50 MG tablet Commonly known as: DESYREL Take 1 tablet (50 mg total) by mouth nightly.   CONTINUE taking these medications  albuterol 0.63 mg/3 mL nebulizer solution Commonly known as: ACCUNEB Inhale 1 ampule by nebulization every six (6) hours as needed for wheezing.  albuterol 90 mcg/actuation inhaler Commonly known as: PROVENTIL HFA;VENTOLIN HFA Inhale 2 puffs every six (6) hours as needed for wheezing.  buprenorphine-naloxone 8-2 mg sublingual film Commonly known as: SUBOXONE Place 1.5 tablets under the tongue daily.  multivitamin per tablet Commonly known as: TAB-A-VITE/THERAGRAN Take 1 tablet by mouth daily.  traMADol 50 mg tablet Commonly known  as: ULTRAM Take 50 mg by mouth Every six (6) hours.    Hospital Course:  Andrea Kane is a 47 year old woman with asthma versus COPD (no PFTs in our system) who presented with cough and hypercarbic/hypoxemic respiratory failure thought to be 2/2 an exacerbation of her underlying pulmonary disease. Presented with symptoms of cough but no change in color or volume of sputum. She was initially hypoxemic with oxygen saturations in the 70s with significant respiratory distress on admission, which improved with nebulized therapies, steroids, IV magnesium x1, and positive pressure ventilation with the assistance of BiPAP. Further evaluation for underlying etiology was unrevealing with clear CXR, negative RVP, LRCx with normal OP flora. Given her initial response to these therapies, she was treated more similarly to a COPD exacerbation with a 5-day course of steroids and azithromycin (6/7-6/12) with slow but significant improvement. Her hypoxemia improved and she has been stable on room air, even with ambulation, for >24 hours. She was discharged with Trelegy Ellipta at discharge and will follow up in pulmonology clinic for further care.    Diagnostic Tests Reviewed:  Hospital Radiology: Xr Chest 2 Views  Result Date: 09/06/2017 EXAM: XR CHEST 2 VIEWS DATE: 09/05/2017 11:58 PM ACCESSION: [id number] UN DICTATED: 09/06/2017 12:15 AM INTERPRETATION LOCATION: [facility] CLINICAL INDICATION: 47 years old Female with DYSPNEA COMPARISON: Chest 05/26/2000 TECHNIQUE: PA and Lateral Chest Radiographs. FINDINGS: Patchy opacity in the lingula. No pleural effusion or pneumothorax. Indistinct left heart border. Otherwise, normal heart size and normal upper mediastinal contours. Multilevel degenerative changes of the spine.  Lingular opacity is suspicious for pneumonia. Recommend follow-up chest radiograph to resolution.  Disposition: Home  Consults: Pulmonology  Discharge Instructions: Follow up IgE level pending at  discharge, Will need PFTs and follow up CXR in 6 weeks  Disease/illness Education: In writing  Home Health/Community Services Discussions/Referrals: N/A  Establishment or re-establishment of referral orders for community resources: N/A  Discussion with other health care providers: With discharge physician  Assessment and Support of treatment regimen adherence: N/A  Appointments Coordinated with: Patient   Education for self-management, independent living, and ADLs: N/A  Feeling great since she got out! Doing well on the trelegy. No SOB. No wheezing.   Relevant past medical, surgical, family and social history reviewed and updated as indicated. Interim medical history since our last visit reviewed. Allergies and medications reviewed and updated.  Review of Systems  Constitutional: Negative.   HENT: Negative.   Respiratory: Negative.   Cardiovascular: Positive for chest pain. Negative for palpitations and leg swelling.  Gastrointestinal: Negative.   Neurological: Negative.   Psychiatric/Behavioral: Negative.     Per HPI unless specifically indicated above     Objective:    BP 111/75 (BP Location: Left Arm, Patient Position: Sitting, Cuff Size: Normal)   Pulse 70   Temp 98.9 F (37.2 C)   Wt 161 lb 2 oz (73.1 kg)   SpO2 98%   BMI 29.09 kg/m   Wt Readings from Last 3 Encounters:  09/15/17 161 lb 2 oz (73.1 kg)  07/04/17 158 lb (71.7 kg)  10/08/16 175 lb (79.4 kg)    Physical Exam  Constitutional: She is oriented to person, place, and time. She appears well-developed and well-nourished. No distress.  HENT:  Head: Normocephalic and atraumatic.  Right Ear: Hearing normal.  Left Ear: Hearing normal.  Nose: Nose normal.  Eyes: Conjunctivae and lids are normal. Right eye exhibits no discharge. Left eye exhibits no discharge. No scleral icterus.  Cardiovascular: Normal rate, regular rhythm, normal heart sounds and intact distal pulses. Exam reveals no gallop and no  friction rub.  No murmur heard. Pulmonary/Chest: Effort normal and breath sounds normal. No stridor. No respiratory distress. She has no wheezes. She has no rales. She exhibits no tenderness.  Musculoskeletal: Normal range of motion.  Neurological: She is alert and oriented to person, place, and time.  Skin: Skin is warm, dry and intact. Capillary refill takes less than 2 seconds. No rash noted. She is not diaphoretic. No erythema. No pallor.  Psychiatric: She has a normal mood and affect. Her speech is normal and behavior is normal. Judgment and thought content normal. Cognition and memory are normal.  Nursing note and vitals reviewed.   Results for orders placed or performed during the hospital encounter of 10/08/16  Urinalysis, Complete w Microscopic  Result Value Ref Range   Color, Urine YELLOW (A) YELLOW   APPearance CLOUDY (A) CLEAR   Specific Gravity, Urine 1.016 1.005 - 1.030   pH 5.0 5.0 - 8.0   Glucose, UA NEGATIVE NEGATIVE mg/dL   Hgb urine dipstick NEGATIVE NEGATIVE   Bilirubin Urine NEGATIVE NEGATIVE   Ketones, ur 80 (A) NEGATIVE mg/dL   Protein, ur NEGATIVE NEGATIVE mg/dL   Nitrite NEGATIVE NEGATIVE   Leukocytes, UA SMALL (A) NEGATIVE   RBC / HPF 0-5 0 - 5 RBC/hpf   WBC, UA 0-5 0 - 5 WBC/hpf   Bacteria, UA NONE SEEN NONE SEEN   Squamous Epithelial / LPF 6-30 (A) NONE SEEN   Mucus PRESENT  Amorphous Crystal PRESENT   CBC  Result Value Ref Range   WBC 15.4 (H) 3.6 - 11.0 K/uL   RBC 4.92 3.80 - 5.20 MIL/uL   Hemoglobin 15.0 12.0 - 16.0 g/dL   HCT 04.5 40.9 - 81.1 %   MCV 88.3 80.0 - 100.0 fL   MCH 30.5 26.0 - 34.0 pg   MCHC 34.5 32.0 - 36.0 g/dL   RDW 91.4 78.2 - 95.6 %   Platelets 310 150 - 440 K/uL  Comprehensive metabolic panel  Result Value Ref Range   Sodium 138 135 - 145 mmol/L   Potassium 3.5 3.5 - 5.1 mmol/L   Chloride 107 101 - 111 mmol/L   CO2 22 22 - 32 mmol/L   Glucose, Bld 145 (H) 65 - 99 mg/dL   BUN 9 6 - 20 mg/dL   Creatinine, Ser 2.13 0.44  - 1.00 mg/dL   Calcium 9.1 8.9 - 08.6 mg/dL   Total Protein 7.0 6.5 - 8.1 g/dL   Albumin 4.2 3.5 - 5.0 g/dL   AST 18 15 - 41 U/L   ALT 16 14 - 54 U/L   Alkaline Phosphatase 62 38 - 126 U/L   Total Bilirubin 0.7 0.3 - 1.2 mg/dL   GFR calc non Af Amer >60 >60 mL/min   GFR calc Af Amer >60 >60 mL/min   Anion gap 9 5 - 15  Lipase, blood  Result Value Ref Range   Lipase 16 11 - 51 U/L  Troponin I  Result Value Ref Range   Troponin I <0.03 <0.03 ng/mL  Pregnancy, urine  Result Value Ref Range   Preg Test, Ur NEGATIVE NEGATIVE      Assessment & Plan:   Problem List Items Addressed This Visit      Respiratory   COPD (chronic obstructive pulmonary disease) (HCC) - Primary    Doing well on the trelegy. Will check CXR in 1 month. Will refer to [facility] pulmonology. Call with any concerns.       Relevant Medications   TRELEGY ELLIPTA 100-62.5-25 MCG/INH AEPB   Other Relevant Orders   Ambulatory referral to Pulmonology    Other Visit Diagnoses    Screening for HIV (human immunodeficiency virus)       Labs to be drawn today. Await results.        Follow up plan: Return in about 1 month (around 10/13/2017) for Physical.

## 2017-09-15 NOTE — Assessment & Plan Note (Signed)
Doing well on the trelegy. Will check CXR in 1 month. [facility]. Call with any concerns.

## 2017-09-16 ENCOUNTER — Encounter: Payer: Self-pay | Admitting: Family Medicine

## 2017-09-16 LAB — HIV ANTIBODY (ROUTINE TESTING W REFLEX): HIV Screen 4th Generation wRfx: NONREACTIVE

## 2017-10-08 DIAGNOSIS — J449 Chronic obstructive pulmonary disease, unspecified: Secondary | ICD-10-CM | POA: Diagnosis not present

## 2017-10-08 DIAGNOSIS — G8929 Other chronic pain: Secondary | ICD-10-CM | POA: Diagnosis not present

## 2017-10-16 ENCOUNTER — Encounter: Payer: Medicare Other | Admitting: Family Medicine

## 2017-10-16 ENCOUNTER — Encounter: Payer: Self-pay | Admitting: Family Medicine

## 2017-10-24 IMAGING — CT CT ABD-PELV W/ CM
2 of 5 series · 16 of 46 positions shown, 18 images · IV contrast (APPLIED)
Comparison: None.

CLINICAL DATA: Initial evaluation for acute abdominal pain with
vomiting.

EXAM:
CT ABDOMEN AND PELVIS WITH CONTRAST
TECHNIQUE: Multidetector CT imaging of the abdomen and pelvis was performed
using the standard protocol following bolus administration of
intravenous contrast.
CONTRAST:  100mL 27M3MR-IJJ IOPAMIDOL (27M3MR-IJJ) INJECTION 61%

[Series 2: routine abd/pel with · axial · 0.91mm/px · z∈[-1093,-698]mm · 13 of 89 slices shown, 15 images]
[im 5/89  soft-tissue]
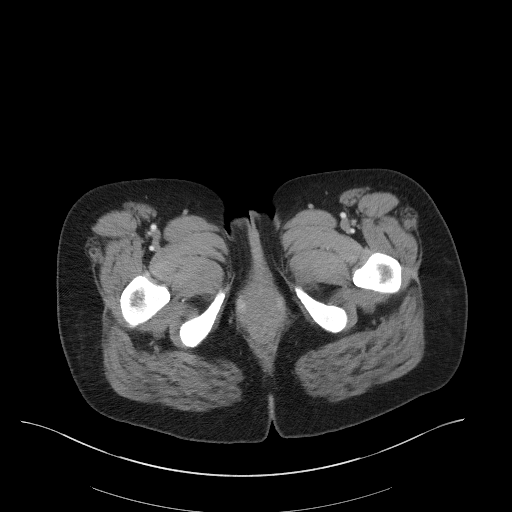
[im 5/89  bone]
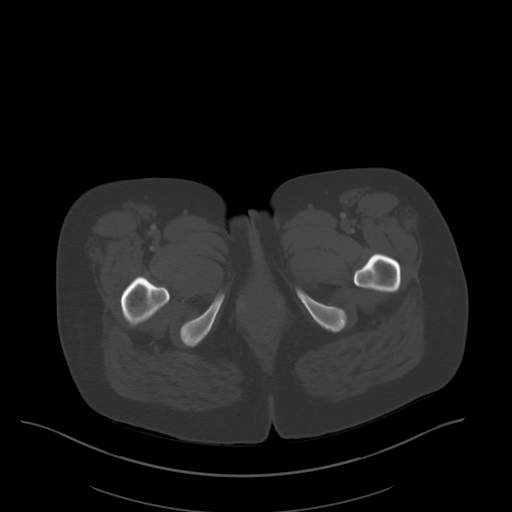
[im 14/89  soft-tissue]
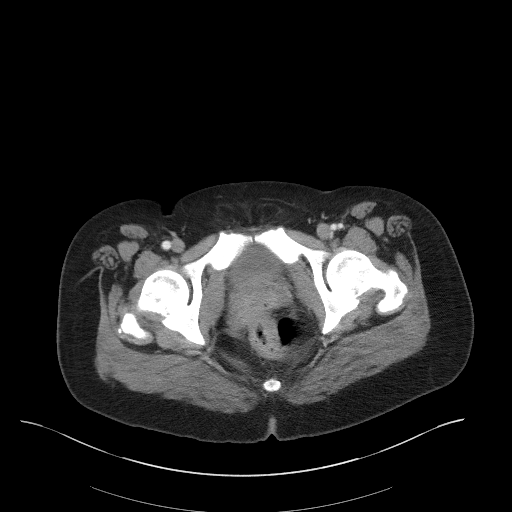
[im 18/89  soft-tissue]
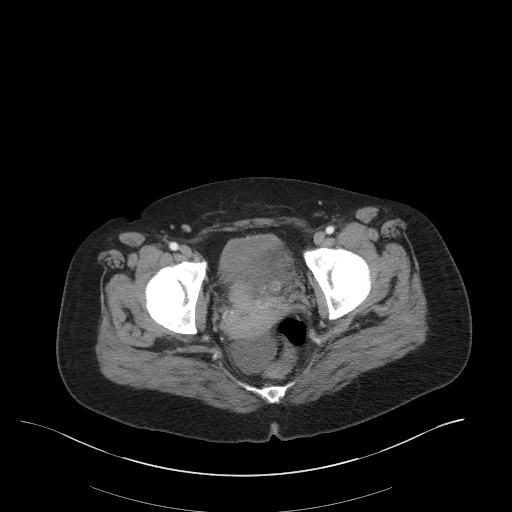
[im 27/89  soft-tissue]
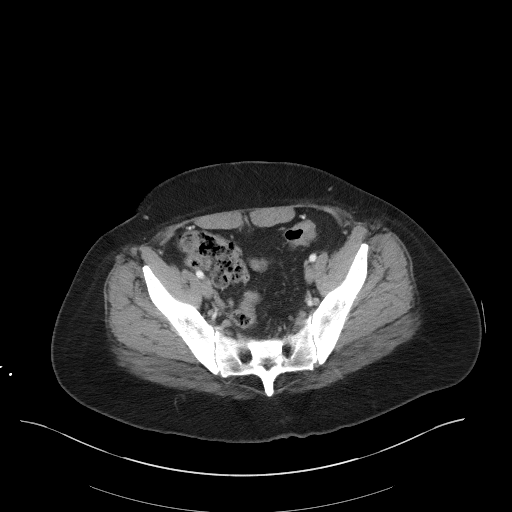
[im 31/89  soft-tissue]
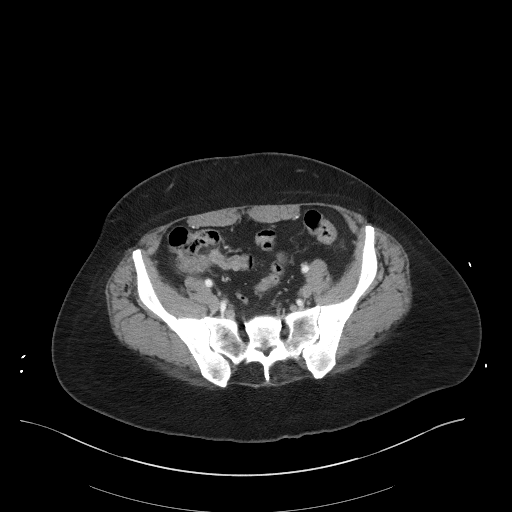
[im 40/89  soft-tissue]
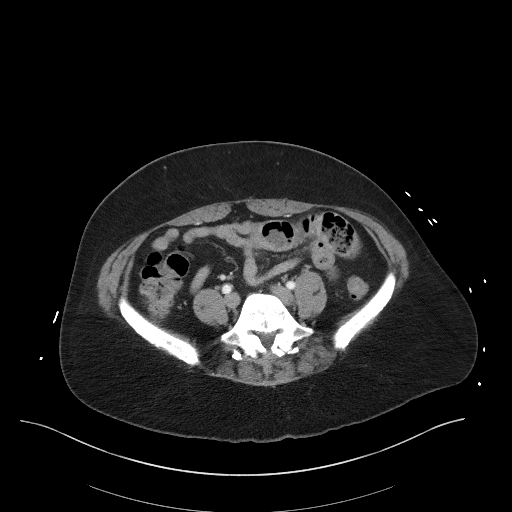
[im 45/89  soft-tissue]
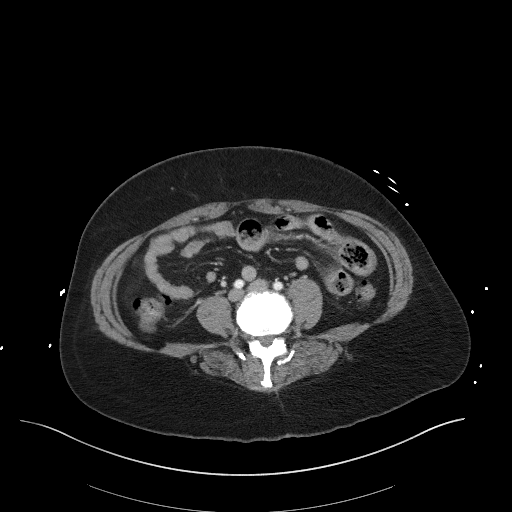
[im 49/89  soft-tissue]
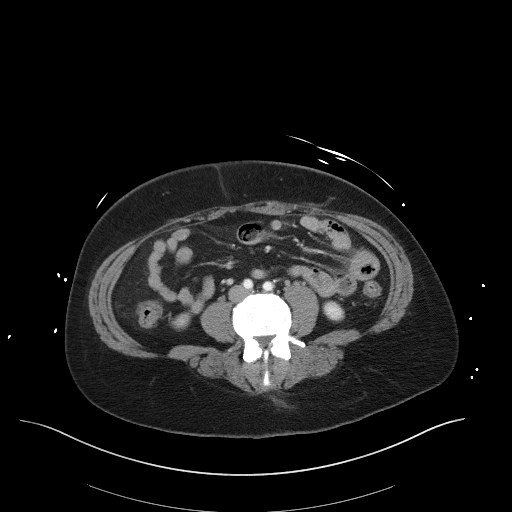
[im 58/89  soft-tissue]
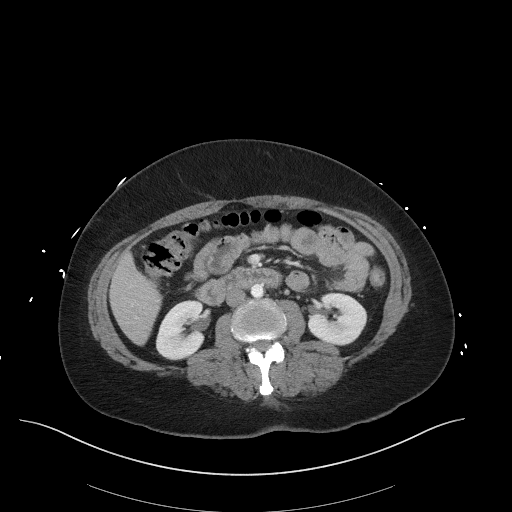
[im 58/89  bone]
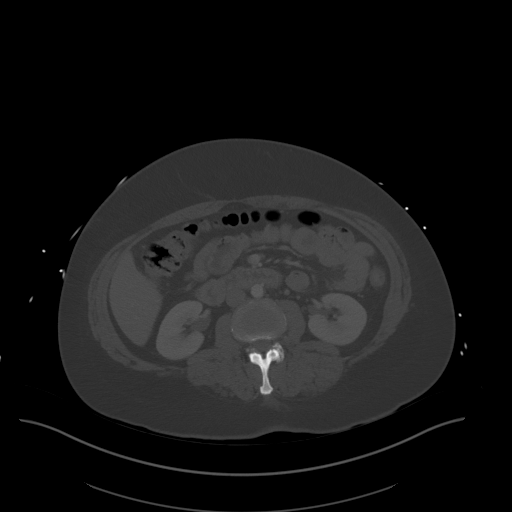
[im 62/89  soft-tissue]
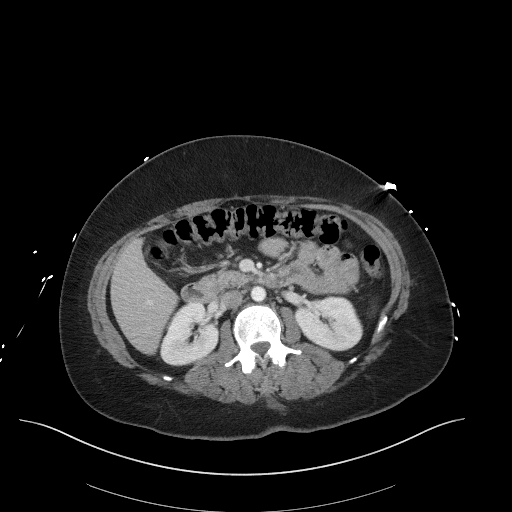
[im 71/89  soft-tissue]
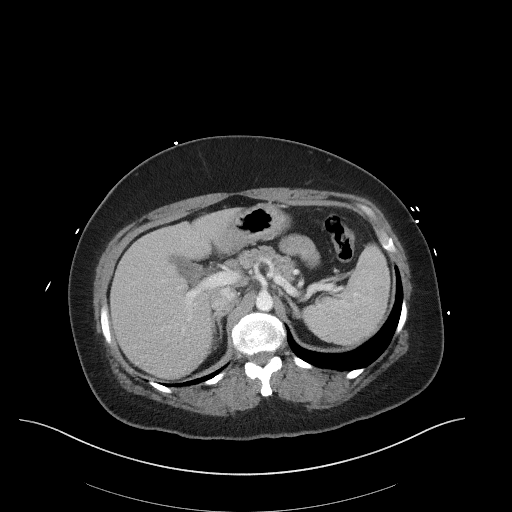
[im 75/89  soft-tissue]
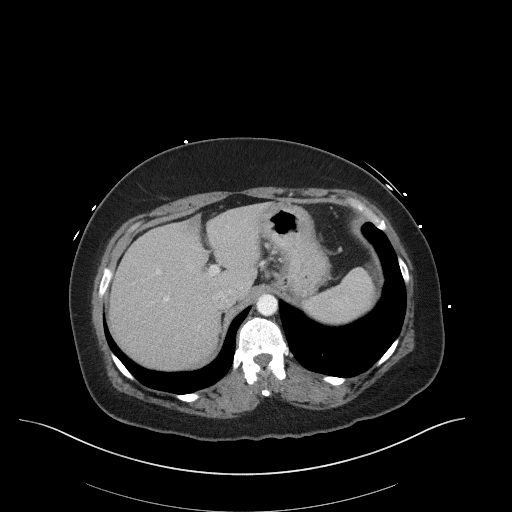
[im 84/89  soft-tissue]
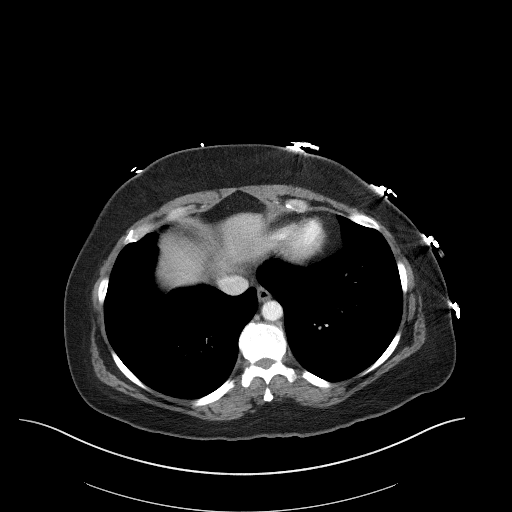

[Series 6: coronal st · coronal · 0.78mm/px · 3 of 99 slices shown]
[im 33/99  soft-tissue]
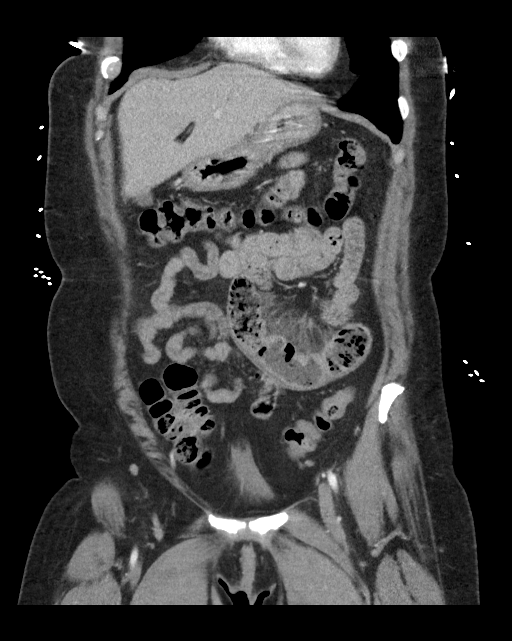
[im 44/99  soft-tissue]
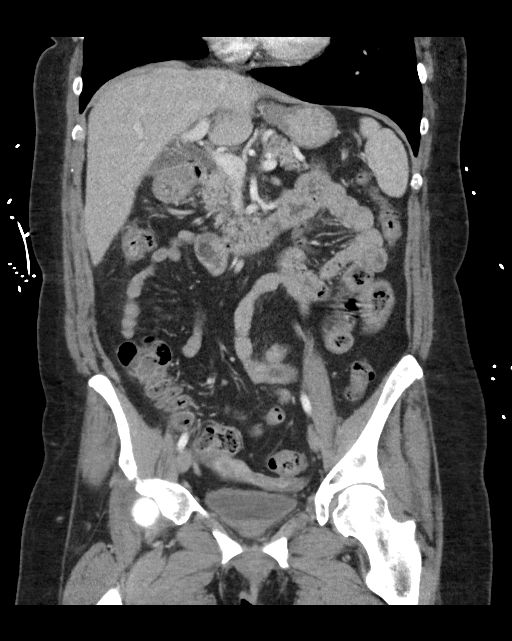
[im 55/99  soft-tissue]
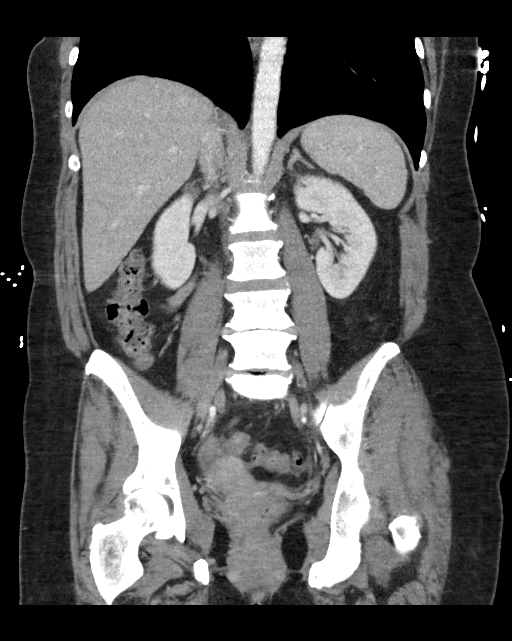

[16 of 46 positions shown; findings below may reference images not displayed]

FINDINGS: Lower chest: Visualized lung bases are clear.

Hepatobiliary: Liver demonstrates a normal contrast enhanced
appearance. Gallbladder within normal limits. No biliary dilatation.

Pancreas: Pancreas within normal limits.

Spleen: Spleen within normal limits.

Adrenals/Urinary Tract: Adrenal glands are normal. Kidneys equal in
size with symmetric enhancement. No nephrolithiasis, hydronephrosis,
or focal enhancing renal mass. No hydroureter. Bladder within normal
limits.

Stomach/Bowel: Stomach within normal limits. Appendix within normal
limits. There is abnormal wall thickening with hazy inflammatory
stranding about a loop of prominent small bowel within the left
abdomen. Loop of bowel E is somewhat prominent measuring up to 3 cm
in diameter. Fecalization of enteric contents. No definite discrete
transition point. Finding is indeterminate, but may reflect a focal
enteritis. An early or partial obstructive process could also be
considered. Small bowel within normal limits distally. Colon within
normal limits.

Vascular/Lymphatic: Normal intravascular enhancement seen throughout
the intra-abdominal aorta and its branch vessels. No adenopathy.

Reproductive: Uterus within normal limits. Probable small bilateral
corpus luteal cysts noted. Ovaries otherwise unremarkable.

Other: Small volume free fluid present within the pelvis, which may
be reactive or physiologic. No free intraperitoneal air.

Musculoskeletal: No acute osseus abnormality. No worrisome lytic or
blastic osseous lesions. Facet arthropathy noted within the lumbar
spine.
IMPRESSION: 1. Abnormal wall thickening with hazy inflammatory stranding about a
prominent loop of small bowel within the left mid abdomen. Finding
favored to reflect a focal enteritis. While no definite transition
point is identified, a partial or early obstructive process could be
considered in the correct clinical setting.
2. Small volume free fluid within the pelvis, which may be reactive
and/or physiologic.

## 2017-10-29 DIAGNOSIS — M545 Low back pain: Secondary | ICD-10-CM | POA: Diagnosis not present

## 2017-10-29 DIAGNOSIS — G8929 Other chronic pain: Secondary | ICD-10-CM | POA: Diagnosis not present

## 2017-11-08 DIAGNOSIS — J449 Chronic obstructive pulmonary disease, unspecified: Secondary | ICD-10-CM | POA: Diagnosis not present

## 2017-11-13 ENCOUNTER — Encounter: Payer: Self-pay | Admitting: Family Medicine

## 2017-11-19 DIAGNOSIS — M545 Low back pain: Secondary | ICD-10-CM | POA: Diagnosis not present

## 2017-11-19 DIAGNOSIS — G8929 Other chronic pain: Secondary | ICD-10-CM | POA: Diagnosis not present

## 2017-12-09 DIAGNOSIS — J449 Chronic obstructive pulmonary disease, unspecified: Secondary | ICD-10-CM | POA: Diagnosis not present

## 2017-12-10 DIAGNOSIS — M545 Low back pain: Secondary | ICD-10-CM | POA: Diagnosis not present

## 2017-12-31 DIAGNOSIS — M545 Low back pain: Secondary | ICD-10-CM | POA: Diagnosis not present

## 2017-12-31 DIAGNOSIS — G8929 Other chronic pain: Secondary | ICD-10-CM | POA: Diagnosis not present

## 2018-01-08 DIAGNOSIS — J449 Chronic obstructive pulmonary disease, unspecified: Secondary | ICD-10-CM | POA: Diagnosis not present

## 2018-02-08 DIAGNOSIS — J449 Chronic obstructive pulmonary disease, unspecified: Secondary | ICD-10-CM | POA: Diagnosis not present

## 2018-03-10 DIAGNOSIS — J449 Chronic obstructive pulmonary disease, unspecified: Secondary | ICD-10-CM | POA: Diagnosis not present

## 2018-04-10 DIAGNOSIS — J449 Chronic obstructive pulmonary disease, unspecified: Secondary | ICD-10-CM | POA: Diagnosis not present

## 2018-05-11 DIAGNOSIS — J449 Chronic obstructive pulmonary disease, unspecified: Secondary | ICD-10-CM | POA: Diagnosis not present

## 2018-07-07 ENCOUNTER — Ambulatory Visit (INDEPENDENT_AMBULATORY_CARE_PROVIDER_SITE_OTHER): Payer: Medicare Other | Admitting: Family Medicine

## 2018-07-07 ENCOUNTER — Encounter: Payer: Self-pay | Admitting: Family Medicine

## 2018-07-07 ENCOUNTER — Other Ambulatory Visit: Payer: Self-pay

## 2018-07-07 DIAGNOSIS — Z539 Procedure and treatment not carried out, unspecified reason: Secondary | ICD-10-CM

## 2018-07-07 NOTE — Progress Notes (Signed)
Called patient several times at pre-arranged time. She did not answer. LMOM for her to call back. Appointment not done.

## 2018-07-10 ENCOUNTER — Other Ambulatory Visit: Payer: Self-pay

## 2018-07-10 ENCOUNTER — Encounter: Payer: Self-pay | Admitting: Family Medicine

## 2018-07-10 ENCOUNTER — Ambulatory Visit (INDEPENDENT_AMBULATORY_CARE_PROVIDER_SITE_OTHER): Payer: Medicare Other | Admitting: Family Medicine

## 2018-07-10 ENCOUNTER — Ambulatory Visit (INDEPENDENT_AMBULATORY_CARE_PROVIDER_SITE_OTHER): Payer: Medicare Other

## 2018-07-10 DIAGNOSIS — J449 Chronic obstructive pulmonary disease, unspecified: Secondary | ICD-10-CM

## 2018-07-10 DIAGNOSIS — Z30013 Encounter for initial prescription of injectable contraceptive: Secondary | ICD-10-CM

## 2018-07-10 DIAGNOSIS — F339 Major depressive disorder, recurrent, unspecified: Secondary | ICD-10-CM

## 2018-07-10 LAB — PREGNANCY, URINE: Preg Test, Ur: NEGATIVE

## 2018-07-10 MED ORDER — TRELEGY ELLIPTA 100-62.5-25 MCG/INH IN AEPB: 1.0000 | INHALATION_SPRAY | Freq: Every day | RESPIRATORY_TRACT | 6 refills | Status: DC

## 2018-07-10 MED ORDER — ALBUTEROL SULFATE HFA 108 (90 BASE) MCG/ACT IN AERS: 2.0000 | INHALATION_SPRAY | Freq: Four times a day (QID) | RESPIRATORY_TRACT | 0 refills | Status: DC | PRN

## 2018-07-10 MED ORDER — MEDROXYPROGESTERONE ACETATE 150 MG/ML IM SUSP
150.0000 mg | INTRAMUSCULAR | Status: DC
  Administered 2018-07-10: 150 mg via INTRAMUSCULAR

## 2018-07-10 MED ORDER — CITALOPRAM HYDROBROMIDE 40 MG PO TABS: 60.0000 mg | ORAL_TABLET | Freq: Every morning | ORAL | 3 refills | Status: DC

## 2018-07-10 NOTE — Progress Notes (Signed)
There were no vitals taken for this visit.   Subjective:    Patient ID: Andrea Kane, female    DOB: 09-Jun-1970, 48 y.o.   MRN: [id number]  HPI: Andrea Kane is a 48 y.o. female  Chief Complaint  Patient presents with  . Contraception    pt states she has recently became active with a partner, never been on any type of contraception in the past   . Depression   CONTRACEPTION CONCERNS- wants something she doesn't have to remember and wants it quick.  Contraception: Condoms Previous contraception: condoms Sexual activity: monogamous  DEPRESSION Mood status: uncontrolled Satisfied with current treatment?: no Symptom severity: moderate  Duration of current treatment : chronic Side effects: no Medication compliance: good compliance Psychotherapy/counseling: no  Depressed mood: yes Anxious mood: yes Anhedonia: no Significant weight loss or gain: no Insomnia: no  Fatigue: yes Feelings of worthlessness or guilt: yes Impaired concentration/indecisiveness: yes Suicidal ideations: no Hopelessness: no Crying spells: no Depression screen [facility] 2/9 07/10/2018 09/15/2017 07/04/2017  Decreased Interest 2 2 0  Down, Depressed, Hopeless 1 1 0  PHQ - 2 Score 3 3 0  Altered sleeping 2 1 -  Tired, decreased energy 3 2 -  Change in appetite 0 1 -  Feeling bad or failure about yourself  2 1 -  Trouble concentrating 3 1 -  Moving slowly or fidgety/restless 0 2 -  Suicidal thoughts 1 0 -  PHQ-9 Score 14 11 -  Difficult doing work/chores Somewhat difficult Somewhat difficult -   GAD 7 : Generalized Anxiety Score 07/10/2018  Nervous, Anxious, on Edge 0  Control/stop worrying 2  Worry too much - different things 3  Trouble relaxing 2  Restless 0  Easily annoyed or irritable 3  Afraid - awful might happen 2  Total GAD 7 Score 12  Anxiety Difficulty Somewhat difficult   Breathing has been stable. Really likes the trelegy and feels like it works well. No other concerns or complaints at  this time.  Relevant past medical, surgical, family and social history reviewed and updated as indicated. Interim medical history since our last visit reviewed. Allergies and medications reviewed and updated.  Review of Systems  Constitutional: Negative.   HENT: Negative.   Eyes: Negative.   Respiratory: Positive for cough, shortness of breath and wheezing. Negative for apnea, choking, chest tightness and stridor.   Cardiovascular: Negative.   Musculoskeletal: Negative.   Skin: Negative.   Neurological: Negative.   Psychiatric/Behavioral: Positive for dysphoric mood. Negative for agitation, behavioral problems, confusion, decreased concentration, hallucinations, self-injury, sleep disturbance and suicidal ideas. The patient is nervous/anxious. The patient is not hyperactive.     Per HPI unless specifically indicated above     Objective:    There were no vitals taken for this visit.  Wt Readings from Last 3 Encounters:  09/15/17 161 lb 2 oz (73.1 kg)  07/04/17 158 lb (71.7 kg)  10/08/16 175 lb (79.4 kg)    Physical Exam Vitals signs and nursing note reviewed.  Constitutional:      General: She is not in acute distress.    Appearance: Normal appearance. She is not ill-appearing, toxic-appearing or diaphoretic.  HENT:     Head: Normocephalic and atraumatic.     Right Ear: External ear normal.     Left Ear: External ear normal.     Nose: Nose normal.     Mouth/Throat:     Mouth: Mucous membranes are moist.     Pharynx:  Oropharynx is clear.  Eyes:     General: No scleral icterus.       Right eye: No discharge.        Left eye: No discharge.     Conjunctiva/sclera: Conjunctivae normal.     Pupils: Pupils are equal, round, and reactive to light.  Neck:     Musculoskeletal: Normal range of motion.  Pulmonary:     Effort: Pulmonary effort is normal. No respiratory distress.     Comments: Speaking in full sentences Musculoskeletal: Normal range of motion.  Skin:     Coloration: Skin is not jaundiced or pale.     Findings: No bruising, erythema, lesion or rash.  Neurological:     Mental Status: She is alert and oriented to person, place, and time. Mental status is at baseline.  Psychiatric:        Mood and Affect: Mood normal.        Behavior: Behavior normal.        Thought Content: Thought content normal.        Judgment: Judgment normal.     Results for orders placed or performed in visit on 09/15/17  HIV antibody  Result Value Ref Range   HIV Screen 4th Generation wRfx Non Reactive Non Reactive      Assessment & Plan:   Problem List Items Addressed This Visit      Respiratory   COPD (chronic obstructive pulmonary disease) (HCC) - Primary    Stable. Will continue her trelegy and get her refill on her albuterol. Continue to monitor. Call with any concerns.       Relevant Medications   TRELEGY ELLIPTA 100-62.5-25 MCG/INH AEPB   albuterol (PROVENTIL HFA;VENTOLIN HFA) 108 (90 Base) MCG/ACT inhaler     Other   Major depression    Not under great control. Will increase her celexa to  and recheck 1 month. Call with any concerns. Continue to monitor.       Relevant Medications   citalopram (CELEXA) 40 MG tablet    Other Visit Diagnoses    Encounter for initial prescription of injectable contraceptive       Wants depo. Will come in for pregnancy test and if negative can have 1st depo shot today. Follow up 1 month.    Relevant Medications   medroxyPROGESTERone (DEPO-PROVERA) injection 150 mg   Other Relevant Orders   Pregnancy, urine       Follow up plan: Return in about 4 weeks (around 08/07/2018) for follow up mood and depo (for side effects).    . This visit was completed via telephone due to the restrictions of the COVID-19 pandemic. All issues as above were discussed and addressed but no physical exam was performed. If it was felt that the patient should be evaluated in the office, they were directed there. The patient  verbally consented to this visit. Patient was unable to complete an audio/visual visit due to technical difficulties. Initial exam was begun on video visit with visual confirmation with patient, however there was no sound on the physician's end, so the visit was converted to a telephone only visit. Due to the catastrophic nature of the COVID-19 pandemic, this visit was done through audio contact only. . Location of the patient: home . Location of the provider: home . Those involved with this call:  . Provider: Olevia Perches, DO . CMA: Wilhemena Durie, CMA . Front Desk/Registration: Adela Ports  . Time spent on call: 25 minutes on the phone discussing health  concerns. 40 minutes total spent in review of patient's record and preparation of their chart.

## 2018-07-10 NOTE — Assessment & Plan Note (Signed)
Not under great control. Will increase her celexa to 60mg  and recheck 1 month. Call with any concerns. Continue to monitor.

## 2018-07-10 NOTE — Assessment & Plan Note (Signed)
Stable. Will continue her trelegy and get her refill on her albuterol. Continue to monitor. Call with any concerns.

## 2018-08-14 ENCOUNTER — Other Ambulatory Visit: Payer: Self-pay

## 2018-08-14 ENCOUNTER — Ambulatory Visit: Payer: Medicare Other | Admitting: Family Medicine

## 2018-08-14 ENCOUNTER — Encounter: Payer: Self-pay | Admitting: Family Medicine

## 2018-08-14 ENCOUNTER — Ambulatory Visit (INDEPENDENT_AMBULATORY_CARE_PROVIDER_SITE_OTHER): Payer: Medicare Other | Admitting: Family Medicine

## 2018-08-14 DIAGNOSIS — F331 Major depressive disorder, recurrent, moderate: Secondary | ICD-10-CM

## 2018-08-14 DIAGNOSIS — R232 Flushing: Secondary | ICD-10-CM | POA: Diagnosis not present

## 2018-08-14 MED ORDER — ARIPIPRAZOLE 5 MG PO TABS: 5.0000 mg | ORAL_TABLET | Freq: Every day | ORAL | 3 refills | Status: DC

## 2018-08-14 NOTE — Assessment & Plan Note (Signed)
Not doing great. Will add abilify and continue celexa. Recheck 1 month. Call with any concerns.

## 2018-08-14 NOTE — Progress Notes (Signed)
There were no vitals taken for this visit.   Subjective:    Patient ID: Andrea Kane, female    DOB: [birth year], 48 y.o.   MRN: [id number]  HPI: Andrea Kane is a 48 y.o. female  Chief Complaint  Patient presents with  . Depression  . Contraception   DEPRESSION- not sure if she is noticing a difference, getting angry pretty quickly Mood status: uncontrolled Satisfied with current treatment?: no Symptom severity: moderate  Duration of current treatment : months Side effects: no Medication compliance: excellent compliance Psychotherapy/counseling: no  Previous psychiatric medications: celexa Depressed mood: yes Anxious mood: yes Anhedonia: no Significant weight loss or gain: no Insomnia: no  Fatigue: yes Feelings of worthlessness or guilt: yes Impaired concentration/indecisiveness: no Suicidal ideations: no Hopelessness: no Crying spells: no Depression screen [facility] 2/9 08/14/2018 07/10/2018 09/15/2017 07/04/2017  Decreased Interest 3 2 2  0  Down, Depressed, Hopeless 2 1 1  0  PHQ - 2 Score 5 3 3  0  Altered sleeping 0 2 1 -  Tired, decreased energy 3 3 2  -  Change in appetite 0 0 1 -  Feeling bad or failure about yourself  1 2 1  -  Trouble concentrating 2 3 1  -  Moving slowly or fidgety/restless 0 0 2 -  Suicidal thoughts 0 1 0 -  PHQ-9 Score 11 14 11  -  Difficult doing work/chores Somewhat difficult Somewhat difficult Somewhat difficult -   GAD 7 : Generalized Anxiety Score 08/14/2018 07/10/2018  Nervous, Anxious, on Edge 2 0  Control/stop worrying 2 2  Worry too much - different things 3 3  Trouble relaxing 0 2  Restless 1 0  Easily annoyed or irritable 2 3  Afraid - awful might happen 0 2  Total GAD 7 Score 10 12  Anxiety Difficulty Somewhat difficult Somewhat difficult      Relevant past medical, surgical, family and social history reviewed and updated as indicated. Interim medical history since our last visit reviewed. Allergies and medications reviewed and  updated.  Review of Systems  Constitutional: Negative.   Respiratory: Negative.   Cardiovascular: Negative.   Endocrine: Positive for heat intolerance. Negative for cold intolerance, polydipsia, polyphagia and polyuria.  Genitourinary: Negative.   Musculoskeletal: Negative.   Skin: Negative.   Neurological: Negative.   Psychiatric/Behavioral: Positive for dysphoric mood. Negative for agitation, behavioral problems, confusion, decreased concentration, hallucinations, self-injury, sleep disturbance and suicidal ideas. The patient is nervous/anxious. The patient is not hyperactive.     Per HPI unless specifically indicated above     Objective:    There were no vitals taken for this visit.  Wt Readings from Last 3 Encounters:  09/15/17 161 lb 2 oz (73.1 kg)  07/04/17 158 lb (71.7 kg)  10/08/16 175 lb (79.4 kg)    Physical Exam Vitals signs and nursing note reviewed.  Constitutional:      General: She is not in acute distress.    Appearance: Normal appearance. She is not ill-appearing, toxic-appearing or diaphoretic.  HENT:     Head: Normocephalic and atraumatic.     Right Ear: External ear normal.     Left Ear: External ear normal.     Nose: Nose normal.     Mouth/Throat:     Mouth: Mucous membranes are moist.     Pharynx: Oropharynx is clear.  Eyes:     General: No scleral icterus.       Right eye: No discharge.        Left  eye: No discharge.     Conjunctiva/sclera: Conjunctivae normal.     Pupils: Pupils are equal, round, and reactive to light.  Neck:     Musculoskeletal: Normal range of motion.  Pulmonary:     Effort: Pulmonary effort is normal. No respiratory distress.     Comments: Speaking in full sentences Musculoskeletal: Normal range of motion.  Skin:    Coloration: Skin is not jaundiced or pale.     Findings: No bruising, erythema, lesion or rash.  Neurological:     Mental Status: She is alert and oriented to person, place, and time. Mental status is at  baseline.  Psychiatric:        Mood and Affect: Mood normal.        Behavior: Behavior normal.        Thought Content: Thought content normal.        Judgment: Judgment normal.     Results for orders placed or performed in visit on 07/10/18  Pregnancy, urine  Result Value Ref Range   Preg Test, Ur Negative Negative      Assessment & Plan:   Problem List Items Addressed This Visit      Other   Major depression - Primary    Not doing great. Will add abilify and continue celexa. Recheck 1 month. Call with any concerns.        Other Visit Diagnoses    Hot flashes       Advised herbal medicines. Continue depo. Continue to monitor. Call with any concerns.        Follow up plan: Return in about 4 weeks (around 09/11/2018) for follow up mood.   . This visit was completed via telephone due to the restrictions of the COVID-19 pandemic. All issues as above were discussed and addressed but no physical exam was performed. If it was felt that the patient should be evaluated in the office, they were directed there. The patient verbally consented to this visit. Patient was unable to complete an audio/visual visit due to Lack of equipment. Due to the catastrophic nature of the COVID-19 pandemic, this visit was done through audio contact only. Attempted to connect via Doximity, but there was no sound so visit was converted to audio only. . Location of the patient: home . Location of the provider: home . Those involved with this call:  . Provider: Olevia PerchesMegan Johnson, DO . CMA: Tiffany Reel, CMA . Front Desk/Registration: Adela Portshristan Williamson  . Time spent on call: 15  minutes with patient face to face via video conference. More than 50% of this time was spent in counseling and coordination of care. 23 minutes total spent in review of patient's record and preparation of their chart.

## 2018-08-19 DIAGNOSIS — M545 Low back pain: Secondary | ICD-10-CM | POA: Diagnosis not present

## 2018-08-22 DIAGNOSIS — M545 Low back pain: Secondary | ICD-10-CM | POA: Diagnosis not present

## 2018-08-22 DIAGNOSIS — G8929 Other chronic pain: Secondary | ICD-10-CM | POA: Diagnosis not present

## 2018-08-26 DIAGNOSIS — M545 Low back pain: Secondary | ICD-10-CM | POA: Diagnosis not present

## 2018-08-26 DIAGNOSIS — G8929 Other chronic pain: Secondary | ICD-10-CM | POA: Diagnosis not present

## 2018-09-09 DIAGNOSIS — M545 Low back pain: Secondary | ICD-10-CM | POA: Diagnosis not present

## 2018-09-10 ENCOUNTER — Telehealth: Payer: Self-pay | Admitting: Family Medicine

## 2018-09-10 NOTE — Chronic Care Management (AMB) (Signed)
Chronic Care Management   Note  09/10/2018 Name: IMAN REINERTSEN MRN: [id number] DOB: 01/30/1971  VONDA HARTH is a 48 y.o. year old female who is a primary care patient of Valerie Roys, DO. I reached out to Concha Norway by phone today in response to a referral sent by Ms. Elisha Ponder Emrich's health plan.    Ms. Feldkamp was given information about Chronic Care Management services today including:  1. CCM service includes personalized support from designated clinical staff supervised by her physician, including individualized plan of care and coordination with other care providers 2. 24/7 contact phone numbers for assistance for urgent and routine care needs. 3. Service will only be billed when office clinical staff spend 20 minutes or more in a month to coordinate care. 4. Only one practitioner may furnish and bill the service in a calendar month. 5. The patient may stop CCM services at any time (effective at the end of the month) by phone call to the office staff. 6. The patient will be responsible for cost sharing (co-pay) of up to 20% of the service fee (after annual deductible is met).  Patient agreed to services and verbal consent obtained.   Follow up plan: Telephone appointment with CCM team member scheduled for: 09/23/2018  [place]  [email or web address]   ??[phone]

## 2018-09-14 ENCOUNTER — Ambulatory Visit (INDEPENDENT_AMBULATORY_CARE_PROVIDER_SITE_OTHER): Payer: Medicare Other | Admitting: Family Medicine

## 2018-09-14 ENCOUNTER — Encounter: Payer: Self-pay | Admitting: Family Medicine

## 2018-09-14 ENCOUNTER — Other Ambulatory Visit: Payer: Self-pay

## 2018-09-14 VITALS — BP 124/75 | HR 62 | Temp 98.6°F | Ht 62.0 in | Wt 180.0 lb

## 2018-09-14 DIAGNOSIS — Z30017 Encounter for initial prescription of implantable subdermal contraceptive: Secondary | ICD-10-CM | POA: Diagnosis not present

## 2018-09-14 DIAGNOSIS — R232 Flushing: Secondary | ICD-10-CM

## 2018-09-14 DIAGNOSIS — Z13220 Encounter for screening for lipoid disorders: Secondary | ICD-10-CM | POA: Diagnosis not present

## 2018-09-14 DIAGNOSIS — F331 Major depressive disorder, recurrent, moderate: Secondary | ICD-10-CM

## 2018-09-14 MED ORDER — ARIPIPRAZOLE 10 MG PO TABS: 10.0000 mg | ORAL_TABLET | Freq: Every day | ORAL | 3 refills | Status: DC

## 2018-09-14 NOTE — Patient Instructions (Signed)
Etonogestrel implant  What is this medicine?  ETONOGESTREL (et oh noe JES trel) is a contraceptive (birth control) device. It is used to prevent pregnancy. It can be used for up to 3 years.  This medicine may be used for other purposes; ask your health care provider or pharmacist if you have questions.  COMMON BRAND NAME(S): Implanon, Nexplanon  What should I tell my health care provider before I take this medicine?  They need to know if you have any of these conditions:  -abnormal vaginal bleeding  -blood vessel disease or blood clots  -breast, cervical, endometrial, ovarian, liver, or uterine cancer  -diabetes  -gallbladder disease  -heart disease or recent heart attack  -high blood pressure  -high cholesterol or triglycerides  -kidney disease  -liver disease  -migraine headaches  -seizures  -stroke  -tobacco smoker  -an unusual or allergic reaction to etonogestrel, anesthetics or antiseptics, other medicines, foods, dyes, or preservatives  -pregnant or trying to get pregnant  -breast-feeding  How should I use this medicine?  This device is inserted just under the skin on the inner side of your upper arm by a health care professional.  Talk to your pediatrician regarding the use of this medicine in children. Special care may be needed.  Overdosage: If you think you have taken too much of this medicine contact a poison control center or emergency room at once.  NOTE: This medicine is only for you. Do not share this medicine with others.  What if I miss a dose?  This does not apply.  What may interact with this medicine?  Do not take this medicine with any of the following medications:  -amprenavir  -fosamprenavir  This medicine may also interact with the following medications:  -acitretin  -aprepitant  -armodafinil  -bexarotene  -bosentan  -carbamazepine  -certain medicines for fungal infections like fluconazole, ketoconazole, itraconazole and voriconazole  -certain medicines to treat hepatitis, HIV or  AIDS  -cyclosporine  -felbamate  -griseofulvin  -lamotrigine  -modafinil  -oxcarbazepine  -phenobarbital  -phenytoin  -primidone  -rifabutin  -rifampin  -rifapentine  -St. John's wort  -topiramate  This list may not describe all possible interactions. Give your health care provider a list of all the medicines, herbs, non-prescription drugs, or dietary supplements you use. Also tell them if you smoke, drink alcohol, or use illegal drugs. Some items may interact with your medicine.  What should I watch for while using this medicine?  This product does not protect you against HIV infection (AIDS) or other sexually transmitted diseases.  You should be able to feel the implant by pressing your fingertips over the skin where it was inserted. Contact your doctor if you cannot feel the implant, and use a non-hormonal birth control method (such as condoms) until your doctor confirms that the implant is in place. Contact your doctor if you think that the implant may have broken or become bent while in your arm.  You will receive a user card from your health care provider after the implant is inserted. The card is a record of the location of the implant in your upper arm and when it should be removed. Keep this card with your health records.  What side effects may I notice from receiving this medicine?  Side effects that you should report to your doctor or health care professional as soon as possible:  -allergic reactions like skin rash, itching or hives, swelling of the face, lips, or tongue  -breast lumps, breast tissue   changes, or discharge  -breathing problems  -changes in emotions or moods  -if you feel that the implant may have broken or bent while in your arm  -high blood pressure  -pain, irritation, swelling, or bruising at the insertion site  -scar at site of insertion  -signs of infection at the insertion site such as fever, and skin redness, pain or discharge  -signs and symptoms of a blood clot such as breathing  problems; changes in vision; chest pain; severe, sudden headache; pain, swelling, warmth in the leg; trouble speaking; sudden numbness or weakness of the face, arm or leg  -signs and symptoms of liver injury like dark yellow or brown urine; general ill feeling or flu-like symptoms; light-colored stools; loss of appetite; nausea; right upper belly pain; unusually weak or tired; yellowing of the eyes or skin  -unusual vaginal bleeding, discharge  Side effects that usually do not require medical attention (report to your doctor or health care professional if they continue or are bothersome):  -acne  -breast pain or tenderness  -headache  -irregular menstrual bleeding  -nausea  This list may not describe all possible side effects. Call your doctor for medical advice about side effects. You may report side effects to FDA at 1-800-FDA-1088.  Where should I keep my medicine?  This drug is given in a hospital or clinic and will not be stored at home.  NOTE: This sheet is a summary. It may not cover all possible information. If you have questions about this medicine, talk to your doctor, pharmacist, or health care provider.   2019 Elsevier/Gold Standard (2017-02-04 14:11:42)

## 2018-09-14 NOTE — Progress Notes (Signed)
BP 124/75   Pulse 62   Temp 98.6 F (37 C) (Oral)   Ht 5\' 2"  (1.575 m)   Wt 180 lb (81.6 kg)   SpO2 96%   BMI 32.92 kg/m    Subjective:    Patient ID: Andrea Kane, female    DOB: [birth year], 48 y.o.   MRN: [id number]  HPI: Andrea Stackara M Gillison is a 48 y.o. female  Chief Complaint  Patient presents with  . Depression  . Hot Flashes   DEPRESSION Mood status: better Satisfied with current treatment?: no Symptom severity: moderate  Duration of current treatment : months Side effects: no Medication compliance: excellent compliance Psychotherapy/counseling: no  Previous psychiatric medications: celexa and abilify Depressed mood: yes Anxious mood: yes Anhedonia: no Significant weight loss or gain: yes Insomnia: no  Fatigue: yes Feelings of worthlessness or guilt: yes Impaired concentration/indecisiveness: yes Suicidal ideations: no Hopelessness: no Crying spells: yes [facility] 2[facility] 09/14/2018 08/14/2018 07/10/2018 09/15/2017 07/04/2017  Decreased Interest 2 3 2 2  0  Down, Depressed, Hopeless 0 2 1 1  0  PHQ - 2 Score 2 5 3 3  0  Altered sleeping 1 0 2 1 -  Tired, decreased energy 3 3 3 2  -  Change in appetite 2 0 0 1 -  Feeling bad or failure about yourself  2 1 2 1  -  Trouble concentrating 3 2 3 1  -  Moving slowly or fidgety/restless 1 0 0 2 -  Suicidal thoughts 0 0 1 0 -  PHQ-9 Score 14 11 14 11  -  Difficult doing work/chores Somewhat difficult Somewhat difficult Somewhat difficult Somewhat difficult -   Hot flashes are still not doing well. She feels like they have gotten much much worse since starting depo. She also notes she's hungry more and gaining weight on depo- would like subdermal if possible. No other concerns or complaints at this time.   Relevant past medical, surgical, family and social history reviewed and updated as indicated. Interim medical history since our last visit reviewed. Allergies and medications reviewed and updated.  Review of Systems   Constitutional: Negative.   Respiratory: Negative.   Cardiovascular: Negative.   Gastrointestinal: Negative.   Musculoskeletal: Negative.   Skin: Negative.   Neurological: Negative.   Psychiatric/Behavioral: Positive for dysphoric mood. Negative for agitation, behavioral problems, confusion, decreased concentration, hallucinations, self-injury, sleep disturbance and suicidal ideas. The patient is nervous/anxious. The patient is not hyperactive.     Per HPI unless specifically indicated above     Objective:    BP 124/75   Pulse 62   Temp 98.6 F (37 C) (Oral)   Ht 5\' 2"  (1.575 m)   Wt 180 lb (81.6 kg)   SpO2 96%   BMI 32.92 kg/m   Wt Readings from Last 3 Encounters:  09/14/18 180 lb (81.6 kg)  09/15/17 161 lb 2 oz (73.1 kg)  07/04/17 158 lb (71.7 kg)    Physical Exam Vitals signs and nursing note reviewed.  Constitutional:      General: She is not in acute distress.    Appearance: Normal appearance. She is not ill-appearing, toxic-appearing or diaphoretic.  HENT:     Head: Normocephalic and atraumatic.     Right Ear: External ear normal.     Left Ear: External ear normal.     Nose: Nose normal.     Mouth/Throat:     Mouth: Mucous membranes are moist.     Pharynx: Oropharynx is clear.  Eyes:  General: No scleral icterus.       Right eye: No discharge.        Left eye: No discharge.     Extraocular Movements: Extraocular movements intact.     Conjunctiva/sclera: Conjunctivae normal.     Pupils: Pupils are equal, round, and reactive to light.  Neck:     Musculoskeletal: Normal range of motion and neck supple.  Cardiovascular:     Rate and Rhythm: Normal rate and regular rhythm.     Pulses: Normal pulses.     Heart sounds: Normal heart sounds. No murmur. No friction rub. No gallop.   Pulmonary:     Effort: Pulmonary effort is normal. No respiratory distress.     Breath sounds: Normal breath sounds. No stridor. No wheezing, rhonchi or rales.  Chest:     Chest  wall: No tenderness.  Musculoskeletal: Normal range of motion.  Skin:    General: Skin is warm and dry.     Capillary Refill: Capillary refill takes less than 2 seconds.     Coloration: Skin is not jaundiced or pale.     Findings: No bruising, erythema, lesion or rash.  Neurological:     General: No focal deficit present.     Mental Status: She is alert and oriented to person, place, and time. Mental status is at baseline.  Psychiatric:        Mood and Affect: Mood normal.        Behavior: Behavior normal.        Thought Content: Thought content normal.        Judgment: Judgment normal.     Results for orders placed or performed in visit on 07/10/18  Pregnancy, urine  Result Value Ref Range   Preg Test, Ur Negative Negative      Assessment & Plan:   Problem List Items Addressed This Visit      Other   Major depression - Primary    Doing better, but not great. Feels like the abilify helps. Will increase to 10mg  daily and recheck 1 month. Call with any concerns.        Other Visit Diagnoses    Hot flashes       Checking labs today including hormones. She thinks this is from her depo. Would like nexplanon- to see GYN. Call with any concerns.    Relevant Orders   CBC with Differential/Platelet   Comprehensive metabolic panel   Bayer DCA Hb A1c Waived   TSH   UA/M w/rflx Culture, Routine   VITAMIN D 25 Hydroxy (Vit-D Deficiency, Fractures)   Estradiol   LH   FSH   Screening for cholesterol level       Labs drawn today. Await results. Call with any concerns.    Relevant Orders   Lipid Panel w/o Chol/HDL Ratio   Encounter for initial prescription of implantable subdermal contraceptive       Would like to see GYN. Call with any concerns.    Relevant Orders   Ambulatory referral to Obstetrics / Gynecology       Follow up plan: Return in about 4 weeks (around 10/12/2018) for Physical with pap (if not done at GYN).

## 2018-09-14 NOTE — Assessment & Plan Note (Signed)
Doing better, but not great. Feels like the abilify helps. Will increase to 10mg  daily and recheck 1 month. Call with any concerns.

## 2018-09-15 ENCOUNTER — Telehealth: Payer: Self-pay | Admitting: Obstetrics & Gynecology

## 2018-09-15 LAB — CBC WITH DIFFERENTIAL/PLATELET
Basophils Absolute: 0.1 10*3/uL (ref 0.0–0.2)
Basos: 2 %
EOS (ABSOLUTE): 0.4 10*3/uL (ref 0.0–0.4)
Eos: 7 %
Hematocrit: 41.2 % (ref 34.0–46.6)
Hemoglobin: 13.7 g/dL (ref 11.1–15.9)
Immature Grans (Abs): 0 10*3/uL (ref 0.0–0.1)
Immature Granulocytes: 0 %
Lymphocytes Absolute: 2 10*3/uL (ref 0.7–3.1)
Lymphs: 31 %
MCH: 29.9 pg (ref 26.6–33.0)
MCHC: 33.3 g/dL (ref 31.5–35.7)
MCV: 90 fL (ref 79–97)
Monocytes Absolute: 0.4 10*3/uL (ref 0.1–0.9)
Monocytes: 6 %
Neutrophils Absolute: 3.6 10*3/uL (ref 1.4–7.0)
Neutrophils: 54 %
Platelets: 339 10*3/uL (ref 150–450)
RBC: 4.58 x10E6/uL (ref 3.77–5.28)
RDW: 13.1 % (ref 11.7–15.4)
WBC: 6.5 10*3/uL (ref 3.4–10.8)

## 2018-09-15 LAB — UA/M W/RFLX CULTURE, ROUTINE
Bilirubin, UA: NEGATIVE
Glucose, UA: NEGATIVE
Ketones, UA: NEGATIVE
Leukocytes,UA: NEGATIVE
Nitrite, UA: NEGATIVE
Protein,UA: NEGATIVE
RBC, UA: NEGATIVE
Specific Gravity, UA: >1.03 — ABNORMAL HIGH (ref 1.005–1.030)
Urobilinogen, Ur: 0.2 mg/dL (ref 0.2–1.0)
pH, UA: 5 (ref 5.0–7.5)

## 2018-09-15 LAB — LUTEINIZING HORMONE: LH: 29.9 m[IU]/mL

## 2018-09-15 LAB — LIPID PANEL W/O CHOL/HDL RATIO
Cholesterol, Total: 201 mg/dL — ABNORMAL HIGH (ref 100–199)
HDL: 55 mg/dL (ref >39)
LDL Calculated: 137 mg/dL — ABNORMAL HIGH (ref 0–99)
Triglycerides: 46 mg/dL (ref 0–149)
VLDL Cholesterol Cal: 9 mg/dL (ref 5–40)

## 2018-09-15 LAB — COMPREHENSIVE METABOLIC PANEL
ALT: 26 IU/L (ref 0–32)
AST: 21 IU/L (ref 0–40)
Albumin/Globulin Ratio: 2 (ref 1.2–2.2)
Albumin: 4.4 g/dL (ref 3.8–4.8)
Alkaline Phosphatase: 63 IU/L (ref 39–117)
BUN/Creatinine Ratio: 19 (ref 9–23)
BUN: 14 mg/dL (ref 6–24)
Bilirubin Total: <0.2 mg/dL (ref 0.0–1.2)
CO2: 21 mmol/L (ref 20–29)
Calcium: 9 mg/dL (ref 8.7–10.2)
Chloride: 106 mmol/L (ref 96–106)
Creatinine, Ser: 0.72 mg/dL (ref 0.57–1.00)
GFR calc Af Amer: 117 mL/min/{1.73_m2} (ref >59)
GFR calc non Af Amer: 101 mL/min/{1.73_m2} (ref >59)
Globulin, Total: 2.2 g/dL (ref 1.5–4.5)
Glucose: 84 mg/dL (ref 65–99)
Potassium: 4.4 mmol/L (ref 3.5–5.2)
Sodium: 140 mmol/L (ref 134–144)
Total Protein: 6.6 g/dL (ref 6.0–8.5)

## 2018-09-15 LAB — VITAMIN D 25 HYDROXY (VIT D DEFICIENCY, FRACTURES): Vit D, 25-Hydroxy: 26.9 ng/mL — ABNORMAL LOW (ref 30.0–100.0)

## 2018-09-15 LAB — ESTRADIOL: Estradiol: 11.3 pg/mL

## 2018-09-15 LAB — TSH: TSH: 1.8 u[IU]/mL (ref 0.450–4.500)

## 2018-09-15 LAB — FOLLICLE STIMULATING HORMONE: FSH: 33.3 m[IU]/mL

## 2018-09-15 LAB — BAYER DCA HB A1C WAIVED: HB A1C (BAYER DCA - WAIVED): 5.4 % (ref <7.0)

## 2018-09-15 NOTE — Telephone Encounter (Signed)
[facility] referring for Encounter for initial prescription of implantable subdermal contraceptive. Due for depo at the end of the month- would love to get subdermal before that. Called and left voicemail for patient to call back to be schedule

## 2018-09-16 NOTE — Telephone Encounter (Signed)
Called and left voice mail for patient to call back to be schedule °

## 2018-09-18 ENCOUNTER — Encounter: Payer: Self-pay | Admitting: Family Medicine

## 2018-09-21 NOTE — Progress Notes (Signed)
PCP:  Valerie Roys, DO   Chief Complaint  Patient presents with  . Contraception    wants to switch from depo d/t weight gain     HPI:      Ms. Andrea Kane is a 48 y.o. [id number] who LMP was No LMP recorded (lmp unknown). (Menstrual status: Irregular Periods)., presents today for her NP annual examination with [facility] consult, referred by PCP. Her menses are absent with depo; no cramping/BTB. Has had 1 injection so far but complains of significant wt gain and had wanted to change to nexplanon, but heard so many bad things about it. She has decided she would like BTL.   Menses were Q1-4 months prior to depo, lasting 3-4 days, no BTB, mild dysmen, imrpoved with NSAIDs.  Sex activity: not sexually active. Was about a month ago but that relationship ended. Last Pap: about 14 yrs ago (birth of son) Hx of STDs: chlamydia  Last mammogram: not recent There is a FH of breast cancer in her mat aunt, genetic testing not indicated. There is no FH of ovarian cancer. The patient does do self-breast exams.  Tobacco use: none Alcohol use: none Vapes marijuana for back pain due to rod. Hx of pain medication addiction in past and doesn't like to take them for back pain. Exercise: moderately active  She does get adequate calcium but not Vitamin D in her diet. Labs with PCP.    Past Medical History:  Diagnosis Date  . Anxiety   . COPD (chronic obstructive pulmonary disease) ([place])   . Ectopic pregnancy     Past Surgical History:  Procedure Laterality Date  . BACK SURGERY     X3  . OOPHORECTOMY Left     Family History  Problem Relation Age of Onset  . Diabetes Mother   . Hypertension Father   . COPD Sister   . Drug abuse Sister   . Cerebral palsy Son   . Alzheimer's disease Maternal Grandmother   . Brain cancer Maternal Grandmother   . Hypertension Paternal Grandfather   . Hyperlipidemia Paternal Grandfather   . Cancer Other 16       cervical  . Breast cancer Maternal Aunt 55   . Stomach cancer Maternal Aunt     Social History   Socioeconomic History  . Marital status: Single    Spouse name: Not on file  . Number of children: Not on file  . Years of education: Not on file  . Highest education level: Not on file  Occupational History  . Not on file  Social Needs  . Financial resource strain: Not on file  . Food insecurity    Worry: Not on file    Inability: Not on file  . Transportation needs    Medical: Not on file    Non-medical: Not on file  Tobacco Use  . Smoking status: Former Research scientist (life sciences)  . Smokeless tobacco: Never Used  Substance and Sexual Activity  . Alcohol use: No  . Drug use: Not Currently  . Sexual activity: Not Currently  Lifestyle  . Physical activity    Days per week: Not on file    Minutes per session: Not on file  . Stress: Not on file  Relationships  . Social Herbalist on phone: Not on file    Gets together: Not on file    Attends religious service: Not on file    Active member of club or organization: Not on file  Attends meetings of clubs or organizations: Not on file    Relationship status: Not on file  . Intimate partner violence    Fear of current or ex partner: Not on file    Emotionally abused: Not on file    Physically abused: Not on file    Forced sexual activity: Not on file  Other Topics Concern  . Not on file  Social History Narrative  . Not on file    Outpatient Medications Prior to Visit  Medication Sig Dispense Refill  . albuterol (PROVENTIL HFA;VENTOLIN HFA) 108 (90 Base) MCG/ACT inhaler Inhale 2 puffs into the lungs every 6 (six) hours as needed for wheezing or shortness of breath. 1 Inhaler 0  . ARIPiprazole (ABILIFY) 10 MG tablet Take 1 tablet (10 mg total) by mouth daily. 30 tablet 3  . Buprenorphine HCl-Naloxone HCl 8-2 MG FILM Place 3 Film under the tongue daily.   0  . citalopram (CELEXA) 40 MG tablet Take 1.5 tablets (60 mg total) by mouth every morning. 45 tablet 3  . famotidine  (PEPCID) 20 MG tablet Take 20 mg by mouth 2 (two) times daily.  2  . traZODone (DESYREL) 50 MG tablet Take 100 mg by mouth at bedtime.  5  . TRELEGY ELLIPTA 100-62.5-25 MCG/INH AEPB Inhale 1 puff into the lungs daily. 60 each 6  . traMADol (ULTRAM) 50 MG tablet Take 1 tablet (50 mg total) by mouth every 6 (six) hours as needed for moderate pain. (Patient not taking: Reported on 09/22/2018) 30 tablet 0   Facility-Administered Medications Prior to Visit  Medication Dose Route Frequency Provider Last Rate Last Dose  . medroxyPROGESTERone (DEPO-PROVERA) injection 150 mg  150 mg Intramuscular Q90 days Johnson, Megan P, DO   150 mg at 07/10/18 1544      ROS:  Review of Systems  Constitutional: Positive for fatigue. Negative for fever and unexpected weight change.  Respiratory: Positive for shortness of breath. Negative for cough and wheezing.   Cardiovascular: Negative for chest pain, palpitations and leg swelling.  Gastrointestinal: Negative for blood in stool, constipation, diarrhea, nausea and vomiting.  Endocrine: Negative for cold intolerance, heat intolerance and polyuria.  Genitourinary: Positive for vaginal discharge. Negative for dyspareunia, dysuria, flank pain, frequency, genital sores, hematuria, menstrual problem, pelvic pain, urgency, vaginal bleeding and vaginal pain.  Musculoskeletal: Positive for back pain. Negative for joint swelling and myalgias.  Skin: Negative for rash.  Allergic/Immunologic: Positive for environmental allergies.  Neurological: Negative for dizziness, syncope, light-headedness, numbness and headaches.  Hematological: Negative for adenopathy. Bruises/bleeds easily.  Psychiatric/Behavioral: Positive for agitation. Negative for confusion, sleep disturbance and suicidal ideas. The patient is not nervous/anxious.   BREAST: No symptoms   Objective: BP 138/68 (BP Location: Left Arm, Patient Position: Sitting, Cuff Size: Normal)   Pulse 96   Ht 5\' 2"  (1.575 m)    Wt 182 lb (82.6 kg)   LMP  (LMP Unknown)   BMI 33.29 kg/m    Physical Exam Constitutional:      Appearance: She is well-developed.  Genitourinary:     Vulva, vagina, cervix, uterus, right adnexa and left adnexa normal.     No vulval lesion or tenderness noted.     No vaginal discharge, erythema or tenderness.     No cervical polyp.     Uterus is not enlarged or tender.     No right or left adnexal mass present.     Right adnexa not tender.     Left adnexa not  tender.  Neck:     Musculoskeletal: Normal range of motion.     Thyroid: No thyromegaly.  Cardiovascular:     Rate and Rhythm: Normal rate and regular rhythm.     Heart sounds: Normal heart sounds. No murmur.  Pulmonary:     Effort: Pulmonary effort is normal.     Breath sounds: Normal breath sounds.  Chest:     Breasts:        Right: No mass, nipple discharge, skin change or tenderness.        Left: No mass, nipple discharge, skin change or tenderness.  Abdominal:     Palpations: Abdomen is soft.     Tenderness: There is no abdominal tenderness. There is no guarding.  Musculoskeletal: Normal range of motion.  Neurological:     General: No focal deficit present.     Mental Status: She is alert and oriented to person, place, and time.     Cranial Nerves: No cranial nerve deficit.  Skin:    General: Skin is warm and dry.  Psychiatric:        Mood and Affect: Mood normal.        Behavior: Behavior normal.        Thought Content: Thought content normal.        Judgment: Judgment normal.  Vitals signs reviewed.     Assessment/Plan: Encounter for annual routine gynecological examination -   Cervical cancer screening - Plan: Cytology - PAP,   Screening for HPV (human papillomavirus) - Plan: Cytology - PAP,  Encounter for surveillance of injectable contraceptive - Plan: Pt doesn't like depo and would rather have BTL instead of nexplanon. RTO with MD for BTL conf. Cont with next depo inj in meantime due to  scheduling of elective procedures at hosp.   Screening for breast cancer - Plan: MM 3D SCREEN BREAST BILATERAL, Pt to sched mammo   GYN counsel breast self exam, mammography screening, family planning choices, adequate intake of calcium and vitamin D, diet and exercise     F/U  Return in about 1 week (around 09/29/2018) for BTL conf with MD.  Ilona SorrelAlicia B. Copland, PA-C 09/22/2018 12:18 PM

## 2018-09-22 ENCOUNTER — Ambulatory Visit (INDEPENDENT_AMBULATORY_CARE_PROVIDER_SITE_OTHER): Payer: Medicare Other | Admitting: Obstetrics and Gynecology

## 2018-09-22 ENCOUNTER — Other Ambulatory Visit (HOSPITAL_COMMUNITY)
Admission: RE | Admit: 2018-09-22 | Discharge: 2018-09-22 | Disposition: A | Discharge status: Home / Self Care | Payer: Medicare Other | Source: Ambulatory Visit | Attending: Obstetrics and Gynecology | Admitting: Obstetrics and Gynecology

## 2018-09-22 ENCOUNTER — Other Ambulatory Visit: Payer: Self-pay

## 2018-09-22 ENCOUNTER — Encounter: Payer: Self-pay | Admitting: Obstetrics and Gynecology

## 2018-09-22 VITALS — BP 138/68 | HR 96 | Ht 62.0 in | Wt 182.0 lb

## 2018-09-22 DIAGNOSIS — Z1151 Encounter for screening for human papillomavirus (HPV): Secondary | ICD-10-CM | POA: Diagnosis present

## 2018-09-22 DIAGNOSIS — Z3042 Encounter for surveillance of injectable contraceptive: Secondary | ICD-10-CM

## 2018-09-22 DIAGNOSIS — Z124 Encounter for screening for malignant neoplasm of cervix: Secondary | ICD-10-CM

## 2018-09-22 DIAGNOSIS — Z1239 Encounter for other screening for malignant neoplasm of breast: Secondary | ICD-10-CM

## 2018-09-22 DIAGNOSIS — Z01419 Encounter for gynecological examination (general) (routine) without abnormal findings: Secondary | ICD-10-CM

## 2018-09-22 NOTE — Patient Instructions (Signed)
I value your feedback and entrusting us with your care. If you get a [facility] patient survey, I would appreciate you taking the time to let us know about your experience today. Thank you!  [facility]: [phone]  [facility]: [phone]  

## 2018-09-23 ENCOUNTER — Ambulatory Visit (INDEPENDENT_AMBULATORY_CARE_PROVIDER_SITE_OTHER): Payer: Medicare Other | Admitting: *Deleted

## 2018-09-23 DIAGNOSIS — M545 Low back pain: Secondary | ICD-10-CM | POA: Diagnosis not present

## 2018-09-23 DIAGNOSIS — J449 Chronic obstructive pulmonary disease, unspecified: Secondary | ICD-10-CM

## 2018-09-23 DIAGNOSIS — G8929 Other chronic pain: Secondary | ICD-10-CM | POA: Diagnosis not present

## 2018-09-23 NOTE — Chronic Care Management (AMB) (Signed)
  Chronic Care Management   Follow Up Note   09/23/2018 Name: Andrea Kane MRN: [id number] DOB: [birth year]  Referred by: Valerie Roys, DO Reason for referral : Chronic Care Management (COPD )   Andrea Kane is a 48 y.o. year old female who is a primary care patient of Valerie Roys, DO. The CCM team was consulted for assistance with chronic disease management and care coordination needs.    Review of patient status, including review of consultants reports, relevant laboratory and other test results, and collaboration with appropriate care team members and the patient's provider was performed as part of comprehensive patient evaluation and provision of chronic care management services.    Initial outreach to patient, states she needs assistance with transportation assistance and resources for dental offices that take medicaid. Patient states she lives in a stressful environment with the father of her son, but does not have the means to have him move out.   Goals Addressed            This Visit's Progress   . I want to get healthier, wt loss and exercise (pt-stated)       Current Barriers:  Marland Kitchen Knowledge Deficits related to weight loss and starting an exercise program . Financial Constraints.  . Transportation barriers . Needs for dental work . Stress in the home  Nurse Case Manager Clinical Goal(s):  Marland Kitchen Over the next 90 days, patient will work with [facility]  to address needs related to obtaining a healthier lifestyle  Interventions:  . Discussed plans with patient for ongoing care management follow up and provided patient with direct contact information for care management team . Care Guide referral for transportation limitations  . Social Work referral for stress reduction/management  techniques . Discussed ways to incorporate healthier foods into patient's diet. . Discussed patient's goals and ways to achieve goals. (wants to lose 30lbs) . Plan to mail patient education on  diet and exercise.   Patient Self Care Activities:  . Performs ADL's independently . Performs IADL's independently  Initial goal documentation         The care management team will reach out to the patient again over the next 14 days.  The patient has been provided with contact information for the care management team and has been advised to call with any health related questions or concerns.    Merlene Morse Arthur Aydelotte RN, BSN Nurse Case Editor, [facility]ing [facility] Care Management  [phone]) Business Mobile

## 2018-09-24 LAB — CYTOLOGY - PAP
Diagnosis: NEGATIVE
HPV: DETECTED — AB

## 2018-09-25 NOTE — Patient Instructions (Signed)
Thank you allowing the Chronic Care Management Team to be a part of your care! It was a pleasure speaking with you today!  CCM (Chronic Care Management) Team   Juell Radney RN, BSN Nurse Care Coordinator  [phone]  [facility] PharmD  Clinical Pharmacist  [phone]  Eula Fried LCSW Clinical Social Worker [phone]  Goals Addressed            This Visit's Progress   . I want to get healthier, wt loss and exercise (pt-stated)       Current Barriers:  Marland Kitchen Knowledge Deficits related to weight loss and starting an exercise program . Financial Constraints.  . Transportation barriers . Needs for dental work . Stress in the home  Nurse Case Manager Clinical Goal(s):  Marland Kitchen Over the next 90 days, patient will work with [facility]  to address needs related to obtaining a healthier lifestyle  Interventions:  . Discussed plans with patient for ongoing care management follow up and provided patient with direct contact information for care management team . Care Guide referral for transportation limitations  . Social Work referral for stress reduction/management  techniques . Discussed ways to incorporate healthier foods into patient's diet. . Discussed patient's goals and ways to achieve goals. (wants to lose 30lbs) . Plan to mail patient education on diet and exercise.   Patient Self Care Activities:  . Performs ADL's independently . Performs IADL's independently  Initial goal documentation        The patient verbalized understanding of instructions provided today and declined a print copy of patient instruction materials.   The patient has been provided with contact information for the care management team and has been advised to call with any health related questions or concerns.     Exercising to Lose Weight Exercise is structured, repetitive physical activity to improve fitness and health. Getting regular exercise is important for everyone. It is especially  important if you are overweight. Being overweight increases your risk of heart disease, stroke, diabetes, high blood pressure, and several types of cancer. Reducing your calorie intake and exercising can help you lose weight. Exercise is usually categorized as moderate or vigorous intensity. To lose weight, most people need to do a certain amount of moderate-intensity or vigorous-intensity exercise each week. Moderate-intensity exercise  Moderate-intensity exercise is any activity that gets you moving enough to burn at least three times more energy (calories) than if you were sitting. Examples of moderate exercise include:  Walking a mile in 15 minutes.  Doing light yard work.  Biking at an easy pace. Most people should get at least 150 minutes (2 hours and 30 minutes) a week of moderate-intensity exercise to maintain their body weight. Vigorous-intensity exercise Vigorous-intensity exercise is any activity that gets you moving enough to burn at least six times more calories than if you were sitting. When you exercise at this intensity, you should be working hard enough that you are not able to carry on a conversation. Examples of vigorous exercise include:  Running.  Playing a team sport, such as football, basketball, and soccer.  Jumping rope. Most people should get at least 75 minutes (1 hour and 15 minutes) a week of vigorous-intensity exercise to maintain their body weight. How can exercise affect me? When you exercise enough to burn more calories than you eat, you lose weight. Exercise also reduces body fat and builds muscle. The more muscle you have, the more calories you burn. Exercise also:  Improves mood.  Reduces  stress and tension.  Improves your overall fitness, flexibility, and endurance.  Increases bone strength. The amount of exercise you need to lose weight depends on:  Your age.  The type of exercise.  Any health conditions you have.  Your overall physical  ability. Talk to your health care provider about how much exercise you need and what types of activities are safe for you. What actions can I take to lose weight? Nutrition   Make changes to your diet as told by your health care provider or diet and nutrition specialist (dietitian). This may include: ? Eating fewer calories. ? Eating more protein. ? Eating less unhealthy fats. ? Eating a diet that includes fresh fruits and vegetables, whole grains, low-fat dairy products, and lean protein. ? Avoiding foods with added fat, salt, and sugar.  Drink plenty of water while you exercise to prevent dehydration or heat stroke. Activity  Choose an activity that you enjoy and set realistic goals. Your health care provider can help you make an exercise plan that works for you.  Exercise at a moderate or vigorous intensity most days of the week. ? The intensity of exercise may vary from person to person. You can tell how intense a workout is for you by paying attention to your breathing and heartbeat. Most people will notice their breathing and heartbeat get faster with more intense exercise.  Do resistance training twice each week, such as: ? Push-ups. ? Sit-ups. ? Lifting weights. ? Using resistance bands.  Getting short amounts of exercise can be just as helpful as long structured periods of exercise. If you have trouble finding time to exercise, try to include exercise in your daily routine. ? Get up, stretch, and walk around every 30 minutes throughout the day. ? Go for a walk during your lunch break. ? Park your car farther away from your destination. ? If you take public transportation, get off one stop early and walk the rest of the way. ? Make phone calls while standing up and walking around. ? Take the stairs instead of elevators or escalators.  Wear comfortable clothes and shoes with good support.  Do not exercise so much that you hurt yourself, feel dizzy, or get very short of  breath. Where to find more information  U.S. Department of Health and Human Services: ThisPath.fiwww.hhs.gov  Centers for Disease Control and Prevention (CDC): FootballExhibition.com.brwww.cdc.gov Contact a health care provider:  Before starting a new exercise program.  If you have questions or concerns about your weight.  If you have a medical problem that keeps you from exercising. Get help right away if you have any of the following while exercising:  Injury.  Dizziness.  Difficulty breathing or shortness of breath that does not go away when you stop exercising.  Chest pain.  Rapid heartbeat. Summary  Being overweight increases your risk of heart disease, stroke, diabetes, high blood pressure, and several types of cancer.  Losing weight happens when you burn more calories than you eat.  Reducing the amount of calories you eat in addition to getting regular moderate or vigorous exercise each week helps you lose weight. This information is not intended to replace advice given to you by your health care provider. Make sure you discuss any questions you have with your health care provider. Document Released: 04/20/2010 Document Revised: 03/31/2017 Document Reviewed: 03/31/2017 Elsevier Interactive Patient Education  2019 [employer]

## 2018-09-29 ENCOUNTER — Ambulatory Visit: Payer: Medicare Other

## 2018-09-29 ENCOUNTER — Telehealth: Payer: Self-pay

## 2018-09-29 ENCOUNTER — Telehealth: Payer: Self-pay | Admitting: Family Medicine

## 2018-09-29 NOTE — Telephone Encounter (Signed)
Patient Andrea Kane to set up the procedure to have her tubes tied.

## 2018-09-29 NOTE — Telephone Encounter (Signed)
Copied from [place] [id number]. Topic: Referral - Status >> Sep 29, 2018 95:32 AM Simone Curia D wrote: 0/23/3435 Left detailed message about community resources for [facility] transportation and [facility] per signed [facility] 09/22/2018. Also left my name and number Ambrose Mantle, [place] [phone] to return my call.

## 2018-09-29 NOTE — Telephone Encounter (Signed)
I don't set up a procedure for her to get her tubes tied. She saw GYN- she needs to contact them.

## 2018-09-29 NOTE — Telephone Encounter (Signed)
Patient states she forgot and made a mistake and realized after she called OBGYN is setting that up for her. She already got the information. Patient apologized.  Explained to patient not to worry. Let her know to call back if has any other questions or concerns.

## 2018-09-30 DIAGNOSIS — G8929 Other chronic pain: Secondary | ICD-10-CM | POA: Diagnosis not present

## 2018-10-05 ENCOUNTER — Ambulatory Visit: Payer: Medicare Other | Admitting: Obstetrics and Gynecology

## 2018-10-07 DIAGNOSIS — M545 Low back pain: Secondary | ICD-10-CM | POA: Diagnosis not present

## 2018-10-08 ENCOUNTER — Ambulatory Visit: Payer: Medicare Other | Admitting: Obstetrics and Gynecology

## 2018-10-14 DIAGNOSIS — M545 Low back pain: Secondary | ICD-10-CM | POA: Diagnosis not present

## 2018-10-16 ENCOUNTER — Ambulatory Visit (INDEPENDENT_AMBULATORY_CARE_PROVIDER_SITE_OTHER): Payer: Medicare Other | Admitting: Licensed Clinical Social Worker

## 2018-10-16 ENCOUNTER — Other Ambulatory Visit: Payer: Self-pay

## 2018-10-16 DIAGNOSIS — F331 Major depressive disorder, recurrent, moderate: Secondary | ICD-10-CM

## 2018-10-16 NOTE — Chronic Care Management (AMB) (Signed)
  Chronic Care Management    Clinical Social Work Follow Up Note  10/16/2018 Name: Andrea Kane MRN: [id number] DOB: 10/06/1970  Andrea Kane is a 48 y.o. year old female who is a primary care patient of Valerie Roys, DO. The CCM team was consulted for assistance with Mental Health Counseling and Resources.   Review of patient status, including review of consultants reports, other relevant assessments, and collaboration with appropriate care team members and the patient's provider was performed as part of comprehensive patient evaluation and provision of chronic care management services.     Goals Addressed    . "I have daily panic attacks and I need some relief" (pt-stated)       Current Barriers:  . Financial constraints . Limited social support . Mental Health Concerns  . Social Isolation . Limited education about anxiety management tools to implement into her daily life to combat panic attacks* . Lacks knowledge of community resource: available mental health support resources within the area  Clinical Social Work Clinical Goal(s):  Marland Kitchen Over the next 90 days, client will work with SW to address concerns related to gaining appropriate mental health support . Over the next 90 days, client will follow up and investigate mailed mental health resources as directed by LCSW  Interventions: . Patient interviewed and appropriate assessments performed . Provided mental health counseling with regard to anxiety management. Educated patient on coping methods to implement into her daily life to combat depressive symptoms, anxiety attacks and stress. Patient denied any suicidal or homicidal ideations. Encouraged patient to implement deep breathing exercises into her daily routine due to ongoing anxiety . Provided patient with information about available mental health resources within the area . Discussed plans with patient for ongoing care management follow up and provided patient with direct  contact information for care management team . Advised patient to check her mailbox for resources that LCSW will sent out today on 10/16/2018. Marland Kitchen Assisted patient/caregiver with obtaining information about health plan benefits . Provided education to patient/caregiver regarding level of care options.  Patient Self Care Activities:  . Attends all scheduled provider appointments . Calls provider office for new concerns or questions  Initial goal documentation   Follow Up Plan: SW will follow up with patient by phone over the next month  Eula Fried, [place], MSW, [email or web address] Phone: [phone]

## 2018-10-20 ENCOUNTER — Telehealth: Payer: Self-pay

## 2018-10-20 NOTE — Telephone Encounter (Signed)
Copied from [place] [phone]. Topic: Referral - Status >> Oct 20, 2018 88:50 PM Simone Curia D wrote: 2/77/4128 Spoke with patient about Medicaid transportation and [facility]. Ambrose Mantle [phone]

## 2018-10-21 ENCOUNTER — Telehealth: Payer: Self-pay

## 2018-10-21 DIAGNOSIS — M545 Low back pain: Secondary | ICD-10-CM | POA: Diagnosis not present

## 2018-10-22 ENCOUNTER — Ambulatory Visit (INDEPENDENT_AMBULATORY_CARE_PROVIDER_SITE_OTHER): Payer: Medicare Other | Admitting: Obstetrics and Gynecology

## 2018-10-22 ENCOUNTER — Other Ambulatory Visit: Payer: Self-pay

## 2018-10-22 ENCOUNTER — Encounter: Payer: Self-pay | Admitting: Obstetrics and Gynecology

## 2018-10-22 VITALS — BP 116/68 | HR 66 | Ht 62.0 in | Wt 188.0 lb

## 2018-10-22 DIAGNOSIS — Z3009 Encounter for other general counseling and advice on contraception: Secondary | ICD-10-CM

## 2018-10-22 NOTE — Progress Notes (Signed)
Patient ID: Andrea Kane, female   DOB: Apr 15, 1970, 48 y.o.   MRN: [id number]  Reason for Consult: Consult (Talk about BTL)   Referred by Dorcas CarrowJohnson, Megan P, DO  Subjective:     HPI:  Andrea Kane is a 48 y.o. female . She presents today for consultation regarding bilateral tubal ligation.  She reports she has also been having issues with irregular periods.     Past Medical History:  Diagnosis Date  . Anxiety   . COPD (chronic obstructive pulmonary disease) (HCC)   . Ectopic pregnancy    Family History  Problem Relation Age of Onset  . Diabetes Mother   . Hypertension Father   . COPD Sister   . Drug abuse Sister   . Cerebral palsy Son   . Alzheimer's disease Maternal Grandmother   . Brain cancer Maternal Grandmother   . Hypertension Paternal Grandfather   . Hyperlipidemia Paternal Grandfather   . Cancer Other 16       cervical  . Breast cancer Maternal Aunt 55  . Stomach cancer Maternal Aunt    Past Surgical History:  Procedure Laterality Date  . BACK SURGERY     X3  . OOPHORECTOMY Left     Short Social History:  Social History   Tobacco Use  . Smoking status: Former Games developermoker  . Smokeless tobacco: Never Used  Substance Use Topics  . Alcohol use: No    No Known Allergies  Current Outpatient Medications  Medication Sig Dispense Refill  . albuterol (PROVENTIL HFA;VENTOLIN HFA) 108 (90 Base) MCG/ACT inhaler Inhale 2 puffs into the lungs every 6 (six) hours as needed for wheezing or shortness of breath. 1 Inhaler 0  . ARIPiprazole (ABILIFY) 10 MG tablet Take 1 tablet (10 mg total) by mouth daily. 30 tablet 3  . Buprenorphine HCl-Naloxone HCl 8-2 MG FILM Place 3 Film under the tongue daily.   0  . citalopram (CELEXA) 40 MG tablet Take 1.5 tablets (60 mg total) by mouth every morning. 45 tablet 3  . famotidine (PEPCID) 20 MG tablet Take 20 mg by mouth 2 (two) times daily.  2  . Multiple Vitamin (MULTI-VITAMIN) tablet Take by mouth.    . traMADol (ULTRAM) 50 MG  tablet Take 1 tablet (50 mg total) by mouth every 6 (six) hours as needed for moderate pain. 30 tablet 0  . traZODone (DESYREL) 50 MG tablet Take 100 mg by mouth at bedtime.  5  . TRELEGY ELLIPTA 100-62.5-25 MCG/INH AEPB Inhale 1 puff into the lungs daily. 60 each 6   Current Facility-Administered Medications  Medication Dose Route Frequency Provider Last Rate Last Dose  . medroxyPROGESTERone (DEPO-PROVERA) injection 150 mg  150 mg Intramuscular Q90 days Laural BenesJohnson, Megan P, DO   150 mg at 07/10/18 1544    Review of Systems  Constitutional: Negative for chills, fatigue, fever and unexpected weight change.  HENT: Negative for trouble swallowing.  Eyes: Negative for loss of vision.  Respiratory: Negative for cough, shortness of breath and wheezing.  Cardiovascular: Negative for chest pain, leg swelling, palpitations and syncope.  GI: Negative for abdominal pain, blood in stool, diarrhea, nausea and vomiting.  GU: Negative for difficulty urinating, dysuria, frequency and hematuria.  Musculoskeletal: Negative for back pain, leg pain and joint pain.  Skin: Negative for rash.  Neurological: Negative for dizziness, headaches, light-headedness, numbness and seizures.  Psychiatric: Negative for behavioral problem, confusion, depressed mood and sleep disturbance.        Objective:  Objective  Vitals:   10/22/18 1114  BP: 116/68  Pulse: 66  Weight: 188 lb (85.3 kg)  Height: 5\' 2"  (1.575 m)   Body mass index is 34.39 kg/m.  Physical Exam     Assessment/Plan:     48 yo here to discuss birth control options  Discussed BTL vs other forms of birth control- interested in Mirena IUD, Will return next week for insertion No intercourse since June 2020   Wanda, [facility] 10/22/2018 11:42 AM

## 2018-10-23 ENCOUNTER — Telehealth: Payer: Self-pay

## 2018-10-23 NOTE — Telephone Encounter (Signed)
Copied from [place] [phone] [phone]. Topic: Referral - Status >> Oct 23, 2018  2:76 PM Simone Curia D wrote: 3/94/3200 Followed-up with patient about contacting Medicaid transportation and the [facility].  Patient stated she had the information and would make the calls next week. I offered assistance but the patient stated she was fine and would call next week. Ambrose Mantle [phone]

## 2018-11-03 ENCOUNTER — Telehealth: Payer: Self-pay

## 2018-11-05 DIAGNOSIS — M545 Low back pain: Secondary | ICD-10-CM | POA: Diagnosis not present

## 2018-11-11 DIAGNOSIS — M545 Low back pain: Secondary | ICD-10-CM | POA: Diagnosis not present

## 2018-11-11 DIAGNOSIS — G8929 Other chronic pain: Secondary | ICD-10-CM | POA: Diagnosis not present

## 2018-11-18 ENCOUNTER — Other Ambulatory Visit: Payer: Self-pay

## 2018-11-18 ENCOUNTER — Ambulatory Visit (INDEPENDENT_AMBULATORY_CARE_PROVIDER_SITE_OTHER): Payer: Medicare Other | Admitting: Licensed Clinical Social Worker

## 2018-11-18 DIAGNOSIS — F331 Major depressive disorder, recurrent, moderate: Secondary | ICD-10-CM | POA: Diagnosis not present

## 2018-11-18 DIAGNOSIS — F1110 Opioid abuse, uncomplicated: Secondary | ICD-10-CM

## 2018-11-18 NOTE — Chronic Care Management (AMB) (Signed)
Chronic Care Management    Clinical Social Work Follow Up Note  11/18/2018 Name: Andrea Kane MRN: [id number] DOB: [birth year]  Andrea Kane is a 48 y.o. year old female who is a primary care patient of Valerie Roys, DO. The CCM team was consulted for assistance with Mental Health Counseling and Resources.   Review of patient status, including review of consultants reports, other relevant assessments, and collaboration with appropriate care team members and the patient's provider was performed as part of comprehensive patient evaluation and provision of chronic care management services.    Outpatient Encounter Medications as of 11/18/2018  Medication Sig  . albuterol (PROVENTIL HFA;VENTOLIN HFA) 108 (90 Base) MCG/ACT inhaler Inhale 2 puffs into the lungs every 6 (six) hours as needed for wheezing or shortness of breath.  . ARIPiprazole (ABILIFY) 10 MG tablet Take 1 tablet (10 mg total) by mouth daily.  . Buprenorphine HCl-Naloxone HCl 8-2 MG FILM Place 3 Film under the tongue daily.   . citalopram (CELEXA) 40 MG tablet Take 1.5 tablets (60 mg total) by mouth every morning.  . famotidine (PEPCID) 20 MG tablet Take 20 mg by mouth 2 (two) times daily.  . Multiple Vitamin (MULTI-VITAMIN) tablet Take by mouth.  . traMADol (ULTRAM) 50 MG tablet Take 1 tablet (50 mg total) by mouth every 6 (six) hours as needed for moderate pain.  . traZODone (DESYREL) 50 MG tablet Take 100 mg by mouth at bedtime.  . TRELEGY ELLIPTA 100-62.5-25 MCG/INH AEPB Inhale 1 puff into the lungs daily.   Facility-Administered Encounter Medications as of 11/18/2018  Medication  . medroxyPROGESTERone (DEPO-PROVERA) injection 150 mg     Goals Addressed            This Visit's Progress   . "I have daily panic attacks and I need some relief" (pt-stated)       Current Barriers:  . Financial constraints . Limited social support . Mental Health Concerns  . Social Isolation . Limited education about anxiety  management tools to implement into her daily life to combat panic attacks* . Lacks knowledge of community resource: available mental health support resources within the area  Clinical Social Work Clinical Goal(s):  Marland Kitchen Over the next 90 days, client will work with SW to address concerns related to gaining appropriate mental health support . Over the next 90 days, client will follow up and investigate mailed mental health resources as directed by LCSW  Interventions: . Patient interviewed and appropriate assessments performed . Provided mental health counseling with regard to anxiety management. Educated patient on coping methods to implement into her daily life to combat depressive symptoms, anxiety attacks and stress. Patient denied any suicidal or homicidal ideations. Encouraged patient to implement deep breathing exercises into her daily routine due to ongoing anxiety . Provided patient with information about available mental health resources within the area . Discussed plans with patient for ongoing care management follow up and provided patient with direct contact information for care management team . Advised patient to check her mailbox for resources that LCSW will sent out today on 10/16/2018 but patient shares that she did not receive these. LCSW will sent out entire packet again on 11/18/2018 as patient prefers this over [employer].  . Assisted patient/caregiver with obtaining information about health plan benefits . Provided education to patient/caregiver regarding level of care options as well as available mental health resources within the local area.   Patient Self Care Activities:  . Attends all scheduled  provider appointments . Calls provider office for new concerns or questions  Please see past updates related to this goal by clicking on the "Past Updates" button in the selected goal      Follow Up Plan: SW will follow up with patient by phone over the next 30 days   Dickie LaBrooke Greg Cratty, BSW, MSW, LCSW [facility] [place]  [facility] [email or web address] Phone: [phone]

## 2018-11-23 ENCOUNTER — Other Ambulatory Visit: Payer: Self-pay

## 2018-11-23 NOTE — Telephone Encounter (Signed)
Andrea Kane faxed a Rx refill request on citalopram 40 mg tablet

## 2018-11-24 ENCOUNTER — Telehealth: Payer: Self-pay

## 2018-11-25 DIAGNOSIS — M545 Low back pain: Secondary | ICD-10-CM | POA: Diagnosis not present

## 2018-11-25 NOTE — Telephone Encounter (Signed)
Called and spoke with patient. She stated that she would like to switch the citalopram for something else due to side effects (gaining weight). Scheduled a virtual visit 12/09/18 at 10:30 am.

## 2018-11-25 NOTE — Telephone Encounter (Signed)
Needs appointment

## 2018-12-02 ENCOUNTER — Telehealth: Payer: Self-pay

## 2018-12-03 DIAGNOSIS — M545 Low back pain: Secondary | ICD-10-CM | POA: Diagnosis not present

## 2018-12-03 DIAGNOSIS — G8929 Other chronic pain: Secondary | ICD-10-CM | POA: Diagnosis not present

## 2018-12-09 ENCOUNTER — Other Ambulatory Visit: Payer: Self-pay | Admitting: Family Medicine

## 2018-12-09 ENCOUNTER — Encounter: Payer: Self-pay | Admitting: Family Medicine

## 2018-12-09 ENCOUNTER — Other Ambulatory Visit: Payer: Self-pay

## 2018-12-09 ENCOUNTER — Telehealth: Payer: Self-pay

## 2018-12-09 ENCOUNTER — Ambulatory Visit (INDEPENDENT_AMBULATORY_CARE_PROVIDER_SITE_OTHER): Payer: Medicare Other | Admitting: Family Medicine

## 2018-12-09 DIAGNOSIS — M545 Low back pain: Secondary | ICD-10-CM | POA: Diagnosis not present

## 2018-12-09 DIAGNOSIS — F331 Major depressive disorder, recurrent, moderate: Secondary | ICD-10-CM

## 2018-12-09 MED ORDER — BUPROPION HCL ER (SR) 150 MG PO TB12: ORAL_TABLET | ORAL | 3 refills | Status: DC

## 2018-12-09 NOTE — Telephone Encounter (Signed)
Requested medication (s) are due for refill today: yes  Requested medication (s) are on the active medication list: yes  Last refill:  10/22/2018  Future visit scheduled: no  Notes to clinic: review for refill   Requested Prescriptions  Pending Prescriptions Disp Refills   citalopram (CELEXA) 40 MG tablet [Pharmacy Med Name: citalopram 40 mg tablet] 45 tablet 3    Sig: TAKE 1 AND 1/2 [place]     Psychiatry:  Antidepressants - SSRI Passed - 12/09/2018 11:22 AM      Passed - Valid encounter within last 6 months    Recent Outpatient Visits          Today Moderate episode of recurrent major depressive disorder ([place])   [place], Megan P, DO   2 months ago Moderate episode of recurrent major depressive disorder ([place])   [place], Megan P, DO   3 months ago Moderate episode of recurrent major depressive disorder ([place])   [place], Megan P, DO   5 months ago Chronic obstructive pulmonary disease, unspecified COPD type ([place])   [place], Megan P, DO   5 months ago Procedure not carried out   [place], DO             Passed - Completed PHQ-2 or PHQ-9 in the last 360 days.

## 2018-12-09 NOTE — Assessment & Plan Note (Signed)
Stopped all her psychiatric medicines due to weight gain. Will start wellbutrin and recheck 1 month. Call with any concerns.

## 2018-12-09 NOTE — Telephone Encounter (Signed)
Routing to provider  

## 2018-12-09 NOTE — Telephone Encounter (Signed)
Called and spoke to patient. Message relayed. Patient stated that she has not requested the citalopram refill.

## 2018-12-09 NOTE — Progress Notes (Signed)
There were no vitals taken for this visit.   Subjective:    Patient ID: Andrea Kane, female    DOB: 03/20/1971, 48 y.o.   MRN: [id number]  HPI: Andrea Kane is a 48 y.o. female  Chief Complaint  Patient presents with  . Depression   DEPRESSION- stopped taking her abilify and celexa. She notes that  Mood status: uncontrolled Satisfied with current treatment?: no Symptom severity: moderate  Duration of current treatment : off all medicine Side effects: yes Medication compliance: poor compliance Psychotherapy/counseling: no  Previous psychiatric medications: abilify and celexa Depressed mood: yes Anxious mood: yes Anhedonia: no Significant weight loss or gain: yes Insomnia: no  Fatigue: yes Feelings of worthlessness or guilt: yes Impaired concentration/indecisiveness: yes Suicidal ideations: no Hopelessness: no Crying spells: no Depression screen [facility] 2/9 12/09/2018 09/14/2018 08/14/2018 07/10/2018 09/15/2017  Decreased Interest 3 2 3 2 2   Down, Depressed, Hopeless 0 0 2 1 1   PHQ - 2 Score 3 2 5 3 3   Altered sleeping 0 1 0 2 1  Tired, decreased energy 3 3 3 3 2   Change in appetite 3 2 0 0 1  Feeling bad or failure about yourself  2 2 1 2 1   Trouble concentrating 3 3 2 3 1   Moving slowly or fidgety/restless 0 1 0 0 2  Suicidal thoughts 0 0 0 1 0  PHQ-9 Score 14 14 11 14 11   Difficult doing work/chores Very difficult Somewhat difficult Somewhat difficult Somewhat difficult Somewhat difficult    Relevant past medical, surgical, family and social history reviewed and updated as indicated. Interim medical history since our last visit reviewed. Allergies and medications reviewed and updated.  Review of Systems  Constitutional: Positive for unexpected weight change. Negative for activity change, appetite change, chills, diaphoresis, fatigue and fever.  Respiratory: Negative.   Cardiovascular: Negative.   Skin: Negative.   Psychiatric/Behavioral: Positive for decreased  concentration and dysphoric mood. Negative for agitation, behavioral problems, confusion, hallucinations, self-injury, sleep disturbance and suicidal ideas. The patient is nervous/anxious. The patient is not hyperactive.     Per HPI unless specifically indicated above     Objective:    There were no vitals taken for this visit.  Wt Readings from Last 3 Encounters:  10/22/18 188 lb (85.3 kg)  09/22/18 182 lb (82.6 kg)  09/14/18 180 lb (81.6 kg)    Physical Exam Vitals signs and nursing note reviewed.  Pulmonary:     Effort: Pulmonary effort is normal. No respiratory distress.     Comments: Speaking in full sentences Neurological:     Mental Status: She is alert.  Psychiatric:        Mood and Affect: Mood normal.        Behavior: Behavior normal.        Thought Content: Thought content normal.        Judgment: Judgment normal.     Results for orders placed or performed in visit on 09/22/18  Cytology - PAP  Result Value Ref Range   Adequacy      Satisfactory for evaluation  endocervical/transformation zone component PRESENT.   Diagnosis      NEGATIVE FOR INTRAEPITHELIAL LESIONS OR MALIGNANCY. BENIGN REACTIVE/REPARATIVE CHANGES.   HPV DETECTED (A)    Material Submitted CervicoVaginal Pap [ThinPrep Imaged]    CYTOLOGY - PAP PAP RESULT       Assessment & Plan:   Problem List Items Addressed This Visit      Other  Major depression    Stopped all her psychiatric medicines due to weight gain. Will start wellbutrin and recheck 1 month. Call with any concerns.       Relevant Medications   buPROPion (WELLBUTRIN SR) 150 MG 12 hr tablet       Follow up plan: Return in about 4 weeks (around 01/06/2019) for follow up mood.    . This visit was completed via telephone due to the restrictions of the COVID-19 pandemic. All issues as above were discussed and addressed but no physical exam was performed. If it was felt that the patient should be evaluated in the office, they  were directed there. The patient verbally consented to this visit. Patient was unable to complete an audio/visual visit due to Lack of equipment. Due to the catastrophic nature of the COVID-19 pandemic, this visit was done through audio contact only. . Location of the patient: home . Location of the provider: home . Those involved with this call:  . Provider: Olevia PerchesMegan , DO . CMA: Tiffany Reel, CMA . Front Desk/Registration: Adela Portshristan Williamson  . Time spent on call: 21 minutes on the phone discussing health concerns. 30 minutes total spent in review of patient's record and preparation of their chart.

## 2018-12-25 ENCOUNTER — Other Ambulatory Visit: Payer: Self-pay | Admitting: Family Medicine

## 2018-12-25 NOTE — Telephone Encounter (Signed)
Requested medication (s) are due for refill today: yes  Requested medication (s) are on the active medication list: yes  Last refill:  11/25/2018  Future visit scheduled: no  Notes to clinic:  Refill cannot be delegated    Requested Prescriptions  Pending Prescriptions Disp Refills   ARIPiprazole (ABILIFY) 10 MG tablet [Pharmacy Med Name: aripiprazole 10 mg tablet] 30 tablet 3    Sig: TAKE ONE TABLET BY MOUTH ONCE DAILY     Not Delegated - Psychiatry:  Antipsychotics - Second Generation (Atypical) - aripiprazole Failed - 12/25/2018  8:00 AM      Failed - This refill cannot be delegated      Passed - Valid encounter within last 6 months    Recent Outpatient Visits          2 weeks ago Moderate episode of recurrent major depressive disorder ([place])   [place], Megan P, DO   3 months ago Moderate episode of recurrent major depressive disorder ([place])   [place], Megan P, DO   4 months ago Moderate episode of recurrent major depressive disorder ([place])   [place], Megan P, DO   5 months ago Chronic obstructive pulmonary disease, unspecified COPD type ([place])   [place], Megan P, DO   5 months ago Procedure not carried out   [employer], Minerva, DO

## 2018-12-25 NOTE — Telephone Encounter (Signed)
Last OV 12/09/18 

## 2018-12-30 DIAGNOSIS — M545 Low back pain: Secondary | ICD-10-CM | POA: Diagnosis not present

## 2019-01-06 DIAGNOSIS — G8929 Other chronic pain: Secondary | ICD-10-CM | POA: Diagnosis not present

## 2019-01-08 ENCOUNTER — Telehealth: Payer: Self-pay

## 2019-01-08 ENCOUNTER — Ambulatory Visit: Payer: Self-pay | Admitting: *Deleted

## 2019-01-08 DIAGNOSIS — J449 Chronic obstructive pulmonary disease, unspecified: Secondary | ICD-10-CM

## 2019-01-08 NOTE — Chronic Care Management (AMB) (Signed)
  Chronic Care Management   Outreach Note  01/08/2019 Name: Andrea Kane MRN: [id number] DOB: Aug 24, 1970  Referred by: Valerie Roys, DO Reason for referral : Chronic Care Management (Unsuccessful outreach)   An unsuccessful telephone outreach was attempted today. The patient was referred to the case management team by for assistance with care management and care coordination.   Follow Up Plan: A HIPPA compliant phone message was left for the patient providing contact information and requesting a return call.  The care management team will reach out to the patient again over the next 30 days.   Merlene Morse Travanti Mcmanus RN, BSN Nurse Case Editor, [facility]ing [facility] Care Management  [phone]) Business Mobile

## 2019-01-09 DIAGNOSIS — M545 Low back pain: Secondary | ICD-10-CM | POA: Diagnosis not present

## 2019-01-13 DIAGNOSIS — M545 Low back pain: Secondary | ICD-10-CM | POA: Diagnosis not present

## 2019-01-13 DIAGNOSIS — G8929 Other chronic pain: Secondary | ICD-10-CM | POA: Diagnosis not present

## 2019-01-16 DIAGNOSIS — G8929 Other chronic pain: Secondary | ICD-10-CM | POA: Diagnosis not present

## 2019-02-04 DIAGNOSIS — M545 Low back pain: Secondary | ICD-10-CM | POA: Diagnosis not present

## 2019-02-09 ENCOUNTER — Telehealth: Payer: Self-pay

## 2019-02-10 ENCOUNTER — Telehealth: Payer: Self-pay

## 2019-02-10 ENCOUNTER — Ambulatory Visit: Payer: Self-pay | Admitting: *Deleted

## 2019-02-10 DIAGNOSIS — J449 Chronic obstructive pulmonary disease, unspecified: Secondary | ICD-10-CM

## 2019-02-10 NOTE — Chronic Care Management (AMB) (Signed)
  Chronic Care Management   Outreach Note  02/10/2019 Name: Andrea Kane MRN: [id number] DOB: 10-May-1970  Referred by: Valerie Roys, DO Reason for referral : Chronic Care Management (Unsuccsessful Outreach)   A second unsuccessful telephone outreach was attempted today. The patient was referred to the case management team for assistance with care management and care coordination.   Follow Up Plan: A HIPPA compliant phone message was left for the patient providing contact information and requesting a return call.  The care management team will reach out to the patient again over the next 30 days.   Merlene Morse Glade Strausser RN, BSN Nurse Case Editor, commissioning Family Practice/[facility] Care Management  [phone]) [employer]

## 2019-02-11 ENCOUNTER — Ambulatory Visit: Payer: Medicare Other

## 2019-02-11 DIAGNOSIS — M545 Low back pain: Secondary | ICD-10-CM | POA: Diagnosis not present

## 2019-02-12 ENCOUNTER — Ambulatory Visit: Payer: Self-pay | Admitting: Licensed Clinical Social Worker

## 2019-02-12 ENCOUNTER — Telehealth: Payer: Self-pay

## 2019-02-12 NOTE — Chronic Care Management (AMB) (Signed)
  Care Management   Follow Up Note   02/12/2019 Name: AVIENDHA AZBELL MRN: [id number] DOB: 1971-03-07  Referred by: Valerie Roys, DO Reason for referral : Care Coordination   ELENI FRANK is a 48 y.o. year old female who is a primary care patient of Valerie Roys, DO. The care management team was consulted for assistance with care management and care coordination needs.    Review of patient status, including review of consultants reports, relevant laboratory and other test results, and collaboration with appropriate care team members and the patient's provider was performed as part of comprehensive patient evaluation and provision of chronic care management services.    LCSW completed CCM outreach attempt today but was unable to reach patient successfully. A HIPPA compliant voice message was left encouraging patient to return call once available. LCSW rescheduled CCM SW appointment as well.  Eula Fried, BSW, MSW, [facility] Care Management [email or web address] Phone: [phone]

## 2019-02-18 DIAGNOSIS — G8929 Other chronic pain: Secondary | ICD-10-CM | POA: Diagnosis not present

## 2019-02-20 DIAGNOSIS — G8929 Other chronic pain: Secondary | ICD-10-CM | POA: Diagnosis not present

## 2019-02-24 ENCOUNTER — Other Ambulatory Visit: Payer: Self-pay

## 2019-02-24 DIAGNOSIS — M545 Low back pain: Secondary | ICD-10-CM | POA: Diagnosis not present

## 2019-02-24 MED ORDER — TRELEGY ELLIPTA 100-62.5-25 MCG/INH IN AEPB: 1.0000 | INHALATION_SPRAY | Freq: Every day | RESPIRATORY_TRACT | 6 refills | Status: DC

## 2019-02-27 DIAGNOSIS — G8929 Other chronic pain: Secondary | ICD-10-CM | POA: Diagnosis not present

## 2019-03-10 DIAGNOSIS — M545 Low back pain: Secondary | ICD-10-CM | POA: Diagnosis not present

## 2019-03-10 DIAGNOSIS — G8929 Other chronic pain: Secondary | ICD-10-CM | POA: Diagnosis not present

## 2019-03-18 DIAGNOSIS — M545 Low back pain: Secondary | ICD-10-CM | POA: Diagnosis not present

## 2019-03-18 DIAGNOSIS — G8929 Other chronic pain: Secondary | ICD-10-CM | POA: Diagnosis not present

## 2019-03-24 DIAGNOSIS — M545 Low back pain: Secondary | ICD-10-CM | POA: Diagnosis not present

## 2019-03-26 ENCOUNTER — Other Ambulatory Visit: Payer: Self-pay | Admitting: Family Medicine

## 2019-03-28 DIAGNOSIS — G8929 Other chronic pain: Secondary | ICD-10-CM | POA: Diagnosis not present

## 2019-03-31 ENCOUNTER — Telehealth: Payer: Self-pay

## 2019-04-04 DIAGNOSIS — G8929 Other chronic pain: Secondary | ICD-10-CM | POA: Diagnosis not present

## 2019-05-13 DIAGNOSIS — G8929 Other chronic pain: Secondary | ICD-10-CM | POA: Diagnosis not present

## 2019-06-03 DIAGNOSIS — M545 Low back pain: Secondary | ICD-10-CM | POA: Diagnosis not present

## 2019-06-09 DIAGNOSIS — M545 Low back pain: Secondary | ICD-10-CM | POA: Diagnosis not present

## 2019-06-16 DIAGNOSIS — M545 Low back pain: Secondary | ICD-10-CM | POA: Diagnosis not present

## 2019-06-17 ENCOUNTER — Ambulatory Visit: Payer: Medicare Other

## 2019-06-20 DIAGNOSIS — M545 Low back pain: Secondary | ICD-10-CM | POA: Diagnosis not present

## 2019-06-20 DIAGNOSIS — G8929 Other chronic pain: Secondary | ICD-10-CM | POA: Diagnosis not present

## 2019-06-30 DIAGNOSIS — M545 Low back pain: Secondary | ICD-10-CM | POA: Diagnosis not present

## 2019-07-07 DIAGNOSIS — M545 Low back pain: Secondary | ICD-10-CM | POA: Diagnosis not present

## 2019-07-07 DIAGNOSIS — G8929 Other chronic pain: Secondary | ICD-10-CM | POA: Diagnosis not present

## 2019-07-12 ENCOUNTER — Ambulatory Visit (INDEPENDENT_AMBULATORY_CARE_PROVIDER_SITE_OTHER): Payer: Medicare Other

## 2019-07-12 DIAGNOSIS — Z0000 Encounter for general adult medical examination without abnormal findings: Secondary | ICD-10-CM

## 2019-07-12 DIAGNOSIS — Z87891 Personal history of nicotine dependence: Secondary | ICD-10-CM | POA: Diagnosis not present

## 2019-07-12 NOTE — Progress Notes (Signed)
Subjective:   Andrea Kane is a 49 y.o. female who presents for an Initial Medicare Annual Wellness Visit.  This visit is being conducted via phone call  - after an attmept to do on video chat - due to the COVID-19 pandemic. This patient has given me verbal consent via phone to conduct this visit, patient states they are participating from their home address. Some vital signs may be absent or patient reported.   Patient identification: identified by name, DOB, and current address.    Review of Systems            Objective:    Today's Vitals   07/12/19 1301  PainSc: 6    There is no height or weight on file to calculate BMI.  Advanced Directives 10/08/2016 02/29/2016 02/29/2016  Does Patient Have a Medical Advance Directive? No No No  Would patient like information on creating a medical advance directive? No - Patient declined No - Patient declined No - Patient declined    Current Medications (verified) Outpatient Encounter Medications as of 07/12/2019  Medication Sig  . albuterol (PROVENTIL HFA;VENTOLIN HFA) 108 (90 Base) MCG/ACT inhaler Inhale 2 puffs into the lungs every 6 (six) hours as needed for wheezing or shortness of breath.  . Buprenorphine HCl-Naloxone HCl 8-2 MG FILM Place 3 Film under the tongue daily.   Marland Kitchen buPROPion (WELLBUTRIN SR) 150 MG 12 hr tablet TAKE ONE TABLET BY MOUTH TWICE DAILY  . famotidine (PEPCID) 20 MG tablet Take 20 mg by mouth 2 (two) times daily.  . traMADol (ULTRAM) 50 MG tablet Take 50-100 mg by mouth 4 (four) times daily as needed.  . traZODone (DESYREL) 50 MG tablet Take 100 mg by mouth at bedtime.  . TRELEGY ELLIPTA 100-62.5-25 MCG/INH AEPB Inhale 1 puff into the lungs daily.   No facility-administered encounter medications on file as of 07/12/2019.    Allergies (verified) Patient has no known allergies.   History: Past Medical History:  Diagnosis Date  . Anxiety   . COPD (chronic obstructive pulmonary disease) ([place])   . Ectopic  pregnancy    Past Surgical History:  Procedure Laterality Date  . BACK SURGERY     X3  . OOPHORECTOMY Left    Family History  Problem Relation Age of Onset  . Diabetes Mother   . Hypertension Father   . COPD Sister   . Drug abuse Sister   . Cerebral palsy Son   . Alzheimer's disease Maternal Grandmother   . Brain cancer Maternal Grandmother   . Hypertension Paternal Grandfather   . Hyperlipidemia Paternal Grandfather   . Cancer Other 16       cervical  . Breast cancer Maternal Aunt 55  . Stomach cancer Maternal Aunt    Social History   Socioeconomic History  . Marital status: Single    Spouse name: Not on file  . Number of children: Not on file  . Years of education: Not on file  . Highest education level: Not on file  Occupational History  . Occupation: disability   Tobacco Use  . Smoking status: Former Smoker    Types: Cigars  . Smokeless tobacco: Never Used  . Tobacco comment: over 6-7 years ago   Substance and Sexual Activity  . Alcohol use: No  . Drug use: Not Currently  . Sexual activity: Not Currently    Birth control/protection: None  Other Topics Concern  . Not on file  Social History Narrative  . Not on file  Social Determinants of Health   Financial Resource Strain:   . Difficulty of Paying Living Expenses:   Food Insecurity: No Food Insecurity  . Worried About Programme researcher, broadcasting/film/video in the Last Year: Never true  . Ran Out of Food in the Last Year: Never true  Transportation Needs: No Transportation Needs  . Lack of Transportation (Medical): No  . Lack of Transportation (Non-Medical): No  Physical Activity: Inactive  . Days of Exercise per Week: 0 days  . Minutes of Exercise per Session: 0 min  Stress:   . Feeling of Stress :   Social Connections: Moderately Isolated  . Frequency of Communication with Friends and Family: More than three times a week  . Frequency of Social Gatherings with Friends and Family: More than three times a week  .  Attends Religious Services: Never  . Active Member of Clubs or Organizations: No  . Attends Banker Meetings: Never  . Marital Status: Never married    Tobacco Counseling Counseling given: Not Answered Comment: over 6-7 years ago    Clinical Intake:  Pre-visit preparation completed: Yes  Pain : 0-10 Pain Score: 6  Pain Type: Chronic pain Pain Location: Back Pain Descriptors / Indicators: Aching Pain Onset: More than a month ago Pain Frequency: Constant     Nutritional Risks: None Diabetes: No  How often do you need to have someone help you when you read instructions, pamphlets, or other written materials from your doctor or pharmacy?: 1 - Never  Interpreter Needed?: No  Information entered by :: Cohan Stipes,LPN   Activities of Daily Living In your present state of health, do you have any difficulty performing the following activities: 07/12/2019 08/14/2018  Hearing? N N  Comment no hearing aids -  Vision? N Y  Comment reading glasses, goes to [facility] as needed -  Difficulty concentrating or making decisions? N Y  Walking or climbing stairs? Y Y  Dressing or bathing? N N  Doing errands, shopping? N N  Preparing Food and eating ? N -  Using the Toilet? N -  In the past six months, have you accidently leaked urine? N -  Do you have problems with loss of bowel control? N -  Managing your Medications? N -  Managing your Finances? N -  Housekeeping or managing your Housekeeping? N -  Comment has some help -  Some recent data might be hidden     Immunizations and Health Maintenance Immunization History  Administered Date(s) Administered  . Influenza,inj,Quad PF,6+ Mos 03/01/2016  . Pneumococcal Polysaccharide-23 03/01/2016  . Tdap 07/04/2017   There are no preventive care reminders to display for this patient.  Patient Care Team: Dorcas Carrow, DO as PCP - General (Family Medicine) [place], Valora Corporal, DO as Referring Physician  (Behavioral Health) Minor, Theadora Rama, RN (Inactive) as [facility] Care Management Gustavus Bryant, LCSW as Social Worker (Licensed Visual merchandiser)  Indicate any recent [employer] you may have received from other than [facility] providers in the past year (date may be approximate).     Assessment:   This is a routine wellness examination for [place].  Hearing/Vision screen No exam data present  Dietary issues and exercise activities discussed: Current Exercise Habits: The patient does not participate in regular exercise at present, Exercise limited by: None identified  Goals Addressed   None    Depression Screen [facility] 2/9 Scores 07/12/2019 12/09/2018 09/14/2018 08/14/2018 07/10/2018 09/15/2017 07/04/2017  PHQ -  2 Score 0 3 2 5 3 3  0  PHQ- 9 Score - 14 14 11 14 11  -    Fall Risk Fall Risk  07/12/2019 08/14/2018 07/04/2017  Falls in the past year? 0 1 Yes  Number falls in past yr: 0 0 1  Injury with Fall? 0 1 No   FALL RISK PREVENTION PERTAINING TO THE HOME:  Any stairs in or around the home? Yes  If so, are there any without handrails? No   Home free of loose throw rugs in walkways, pet beds, electrical cords, etc? Yes  Adequate lighting in your home to reduce risk of falls? Yes   ASSISTIVE DEVICES UTILIZED TO PREVENT FALLS:  Life alert? No  Use of a cane, walker or w/c? Yes   Cane as needed  Grab bars in the bathroom? Yes  Shower chair or bench in shower? No  Elevated toilet seat or a handicapped toilet? No    DME ORDERS:  DME order needed?  No   TIMED UP AND GO:  Unable to perform     Cognitive Function:        Screening Tests Health Maintenance  Topic Date Due  . INFLUENZA VACCINE  10/31/2019  . PAP SMEAR-Modifier  09/21/2021  . TETANUS/TDAP  07/05/2027  . HIV Screening  Completed    Qualifies for Shingles Vaccine? No    Tdap: up to date   Flu Vaccine: declined   Pneumococcal Vaccine: up to date   Covid-19 Vaccine: declined   Cancer  Screenings:  Colorectal Screening: not indicated   Mammogram: ordered.   Bone Density: not indicated   Lung Cancer Screening: (Low Dose CT Chest recommended if Age 51-80 years, 30 pack-year currently smoking OR have quit w/in 15years.) does not qualify.     Additional Screening:  Hepatitis C Screening: does not qualify  Vision Screening: Recommended annual ophthalmology exams for early detection of glaucoma and other disorders of the eye. Is the patient up to date with their annual eye exam?  No    Dental Screening: Recommended annual dental exams for proper oral hygiene  Community Resource Referral:  CRR required this visit?  No       Plan:  I have personally reviewed and addressed the Medicare Annual Wellness questionnaire and have noted the following in the patient's chart:  A. Medical and social history B. Use of alcohol, tobacco or illicit drugs  C. Current medications and supplements D. Functional ability and status E.  Nutritional status F.  Physical activity G. Advance directives H. List of other physicians I.  Hospitalizations, surgeries, and ER visits in previous 12 months J.  Vitals K. Screenings such as hearing and vision if needed, cognitive and depression L. Referrals and appointments   In addition, I have reviewed and discussed with patient certain preventive protocols, quality metrics, and best practice recommendations. A written personalized care plan for preventive services as well as general preventive health recommendations were provided to patient.   Signed,    09/23/2021, LPN   09/04/2027  Nurse Health Advisor   Nurse Notes: none

## 2019-07-16 DIAGNOSIS — G8929 Other chronic pain: Secondary | ICD-10-CM | POA: Diagnosis not present

## 2019-07-28 DIAGNOSIS — M545 Low back pain: Secondary | ICD-10-CM | POA: Diagnosis not present

## 2019-07-31 ENCOUNTER — Other Ambulatory Visit: Payer: Self-pay | Admitting: Family Medicine

## 2019-07-31 NOTE — Telephone Encounter (Signed)
Requested Prescriptions  Pending Prescriptions Disp Refills  . buPROPion (WELLBUTRIN SR) 150 MG 12 hr tablet [Pharmacy Med Name: bupropion HCl SR 150 mg tablet,12 hr sustained-release] 180 tablet 0    Sig: TAKE ONE TABLET BY MOUTH TWICE DAILY     Psychiatry: Antidepressants - bupropion Passed - 07/31/2019  8:00 AM      Passed - Completed PHQ-2 or PHQ-9 in the last 360 days.      Passed - Last BP in normal range    BP Readings from Last 1 Encounters:  10/22/18 116/68         Passed - Valid encounter within last 6 months    Recent Outpatient Visits          7 months ago Moderate episode of recurrent major depressive disorder (HCC)   [facility] [place], Megan P, DO   10 months ago Moderate episode of recurrent major depressive disorder (HCC)   [facility] Johnson, Megan P, DO   11 months ago Moderate episode of recurrent major depressive disorder (HCC)   [facility] Johnson, Megan P, DO   1 year ago Chronic obstructive pulmonary disease, unspecified COPD type (HCC)   [facility] [place], Megan P, DO   1 year ago Procedure not carried out   [facility] [employer], Megan P, DO      Future Appointments            In 11 months [facility], [facility]

## 2019-08-13 DIAGNOSIS — G8929 Other chronic pain: Secondary | ICD-10-CM | POA: Diagnosis not present

## 2019-08-25 DIAGNOSIS — M545 Low back pain: Secondary | ICD-10-CM | POA: Diagnosis not present

## 2019-09-05 DIAGNOSIS — G8929 Other chronic pain: Secondary | ICD-10-CM | POA: Diagnosis not present

## 2019-09-08 DIAGNOSIS — G8929 Other chronic pain: Secondary | ICD-10-CM | POA: Diagnosis not present

## 2019-09-22 DIAGNOSIS — G8929 Other chronic pain: Secondary | ICD-10-CM | POA: Diagnosis not present

## 2019-09-27 DIAGNOSIS — G8929 Other chronic pain: Secondary | ICD-10-CM | POA: Diagnosis not present

## 2019-09-27 DIAGNOSIS — M545 Low back pain: Secondary | ICD-10-CM | POA: Diagnosis not present

## 2019-11-08 ENCOUNTER — Telehealth: Payer: Self-pay

## 2019-11-08 NOTE — Telephone Encounter (Signed)
Patient returning missed calll. Please advise

## 2019-11-08 NOTE — Telephone Encounter (Signed)
Pt called after hour nurse 11/06/19 at 8:35pm wanting a copy or email of her pap results; knows the results just needs the paperwork to hand off to somebody else.  [phone]  Left detailed msg to call and let me know if needs the actual results or proof of annual.

## 2019-11-08 NOTE — Telephone Encounter (Signed)
Pt states she needs the results of her pap smear.  She thought she could get it from MyChart but she is not able to get in to it.  Adv to call [phone] for help to get back in.  Pt will call them and she'll get the results from myChart.

## 2019-11-24 ENCOUNTER — Telehealth: Payer: Self-pay | Admitting: Licensed Clinical Social Worker

## 2019-11-24 NOTE — Telephone Encounter (Signed)
°  Chronic Care Management    Clinical Social Work General Follow Up Note  11/24/2019 Name: Andrea Kane MRN: [id number] DOB: 02-Feb-1971  Andrea Kane is a 49 y.o. year old female who is a primary care patient of Dorcas Carrow, DO. The CCM team was consulted for assistance with Mental Health Counseling and Resources.   Review of patient status, including review of consultants reports, relevant laboratory and other test results, and collaboration with appropriate care team members and the patient's provider was performed as part of comprehensive patient evaluation and provision of chronic care management services.    LCSW completed CCM outreach attempt today to see if patient was interested in gaining social work support but was unable to reach patient successfully. A HIPPA compliant voice message was left encouraging patient to return call once available or talk with PCP if interested in re-referral for CCM program.  Outpatient Encounter Medications as of 11/24/2019  Medication Sig Note   albuterol (PROVENTIL HFA;VENTOLIN HFA) 108 (90 Base) MCG/ACT inhaler Inhale 2 puffs into the lungs every 6 (six) hours as needed for wheezing or shortness of breath.    Buprenorphine HCl-Naloxone HCl 8-2 MG FILM Place 3 Film under the tongue daily.  07/12/2019: One film 3 times daily    buPROPion (WELLBUTRIN SR) 150 MG 12 hr tablet TAKE ONE TABLET BY MOUTH TWICE DAILY    famotidine (PEPCID) 20 MG tablet Take 20 mg by mouth 2 (two) times daily.    traMADol (ULTRAM) 50 MG tablet Take 50-100 mg by mouth 4 (four) times daily as needed.    traZODone (DESYREL) 50 MG tablet Take 100 mg by mouth at bedtime.    TRELEGY ELLIPTA 100-62.5-25 MCG/INH AEPB Inhale 1 puff into the lungs daily.    No facility-administered encounter medications on file as of 11/24/2019.    Dickie La, BSW, MSW, LCSW [facility] Care Management [place]   [facility] [email or web address] Phone: [phone]

## 2019-12-23 DIAGNOSIS — Z03818 Encounter for observation for suspected exposure to other biological agents ruled out: Secondary | ICD-10-CM | POA: Diagnosis not present

## 2019-12-29 DIAGNOSIS — Z03818 Encounter for observation for suspected exposure to other biological agents ruled out: Secondary | ICD-10-CM | POA: Diagnosis not present

## 2020-01-05 DIAGNOSIS — Z03818 Encounter for observation for suspected exposure to other biological agents ruled out: Secondary | ICD-10-CM | POA: Diagnosis not present

## 2020-01-12 DIAGNOSIS — Z03818 Encounter for observation for suspected exposure to other biological agents ruled out: Secondary | ICD-10-CM | POA: Diagnosis not present

## 2020-01-19 DIAGNOSIS — Z03818 Encounter for observation for suspected exposure to other biological agents ruled out: Secondary | ICD-10-CM | POA: Diagnosis not present

## 2020-01-26 DIAGNOSIS — Z03818 Encounter for observation for suspected exposure to other biological agents ruled out: Secondary | ICD-10-CM | POA: Diagnosis not present

## 2020-02-02 DIAGNOSIS — Z03818 Encounter for observation for suspected exposure to other biological agents ruled out: Secondary | ICD-10-CM | POA: Diagnosis not present

## 2020-02-08 DIAGNOSIS — Z03818 Encounter for observation for suspected exposure to other biological agents ruled out: Secondary | ICD-10-CM | POA: Diagnosis not present

## 2020-02-13 DIAGNOSIS — Z03818 Encounter for observation for suspected exposure to other biological agents ruled out: Secondary | ICD-10-CM | POA: Diagnosis not present

## 2020-02-20 DIAGNOSIS — Z03818 Encounter for observation for suspected exposure to other biological agents ruled out: Secondary | ICD-10-CM | POA: Diagnosis not present

## 2020-02-24 DIAGNOSIS — Z03818 Encounter for observation for suspected exposure to other biological agents ruled out: Secondary | ICD-10-CM | POA: Diagnosis not present

## 2020-03-01 ENCOUNTER — Encounter: Payer: Self-pay | Admitting: Family Medicine

## 2020-03-01 ENCOUNTER — Ambulatory Visit (INDEPENDENT_AMBULATORY_CARE_PROVIDER_SITE_OTHER): Payer: Medicare Other | Admitting: Family Medicine

## 2020-03-01 ENCOUNTER — Other Ambulatory Visit: Payer: Self-pay

## 2020-03-01 ENCOUNTER — Ambulatory Visit: Payer: Self-pay | Admitting: *Deleted

## 2020-03-01 DIAGNOSIS — G5603 Carpal tunnel syndrome, bilateral upper limbs: Secondary | ICD-10-CM | POA: Diagnosis not present

## 2020-03-01 NOTE — Progress Notes (Signed)
    SUBJECTIVE:   CHIEF COMPLAINT / HPI: hand swelling and numbness  Decreased grip Off and on over the past month Started with numbness and tingling. Pain the last few nights. Was in tears night before last Laid iwht pillows under arms, with relief.   HAND PAIN Duration: 2 weeks Involved hand: bilateral Mechanism of injury: unsure Location: palmar. Whole hand feels tight. Thumb and pointer numb. Onset: gradual Severity: moderate  Quality: tingling pain Frequency: intermittent, constant now Radiation: yes, up to elbow occasionally Aggravating factors: sleeping on that side or holding something Alleviating factors: warm water, sleeping with arms propped on pillows Treatments attempted: tylenol/ibuprofen not helping, tramadol, suboxone. "Shaking or ringing out" of hands helps. Relief with NSAIDs?: no Weakness: yes Numbness: yes Redness: yes Swelling:yes Bruising: no Fevers: no  Strong FH of diabetes. Was previously gestational diabetic. No known FH of immunocompromised state. Hasn't tried braces.  Doesn't currently work.   PERTINENT  PMH / PSH: COPD, depression, chronic LBP  OBJECTIVE:   BP 127/81   Pulse 73   Temp 99 F (37.2 C)   Wt 151 lb 2 oz (68.5 kg)   SpO2 98%   BMI 27.64 kg/m   Gen: well appearing, in NAD MSK: HAND: Inspection: no asymmetry, swelling, redness, bruising Palpation: non tender to light palpation ROM: full AROM  Strength: 5/5 bicep/tricep flexion/extension, finger abduction/adduction. Slightly decreased grip strength bilaterally. Special Tests: +reverse phalens, tinels. Neurovascular: intact   ASSESSMENT/PLAN:   Carpal tunnel syndrome Symptoms and exam consistent with carpal tunnel. Last A1c and TSH wnl. Advised avoiding triggering movements and recommended using nighttime cock-up wrist splint and OTC ibuprofen for pain relief. F/u if no better in a few weeks.     Caro Laroche, DO

## 2020-03-01 NOTE — Patient Instructions (Signed)
It was great to see you!  Our plans for today:  - Get a cock-up wrist splint (see below). - If you don't have relief with the brace or over the counter pain medication within a few weeks, come back to see [place].      Take care and seek immediate care sooner if you develop any concerns.   Dr. Linwood Dibbles   Carpal Tunnel Syndrome  Carpal tunnel syndrome is a condition that causes pain in your hand and arm. The carpal tunnel is a narrow area located on the palm side of your wrist. Repeated wrist motion or certain diseases may cause swelling within the tunnel. This swelling pinches the main nerve in the wrist (median nerve). What are the causes? This condition may be caused by:  Repeated wrist motions.  Wrist injuries.  Arthritis.  A cyst or tumor in the carpal tunnel.  Fluid buildup during pregnancy. Sometimes the cause of this condition is not known. What increases the risk? The following factors may make you more likely to develop this condition:  Having a job, such as being a Haematologist, that requires you to repeatedly move your wrist in the same motion.  Being a woman.  Having certain conditions, such as: ? Diabetes. ? Obesity. ? An underactive thyroid (hypothyroidism). ? Kidney failure. What are the signs or symptoms? Symptoms of this condition include:  A tingling feeling in your fingers, especially in your thumb, index, and middle fingers.  Tingling or numbness in your hand.  An aching feeling in your entire arm, especially when your wrist and elbow are bent for a long time.  Wrist pain that goes up your arm to your shoulder.  Pain that goes down into your palm or fingers.  A weak feeling in your hands. You may have trouble grabbing and holding items. Your symptoms may feel worse during the night. How is this diagnosed? This condition is diagnosed with a medical history and physical exam. You may also have tests, including:  Electromyogram (EMG).  This test measures electrical signals sent by your nerves into the muscles.  Nerve conduction study. This test measures how well electrical signals pass through your nerves.  Imaging tests, such as X-rays, ultrasound, and MRI. These tests check for possible causes of your condition. How is this treated? This condition may be treated with:  Lifestyle changes. It is important to stop or change the activity that caused your condition.  Doing exercise and activities to strengthen your muscles and bones (physical therapy).  Learning how to use your hand again after diagnosis (occupational therapy).  Medicines for pain and inflammation. This may include medicine that is injected into your wrist.  A wrist splint.  Surgery. Follow these instructions at home: If you have a splint:  Wear the splint as told by your health care provider. Remove it only as told by your health care provider.  Loosen the splint if your fingers tingle, become numb, or turn cold and blue.  Keep the splint clean.  If the splint is not waterproof: ? Do not let it get wet. ? Cover it with a watertight covering when you take a bath or shower. Managing pain, stiffness, and swelling   If directed, put ice on the painful area: ? If you have a removable splint, remove it as told by your health care provider. ? Put ice in a plastic bag. ? Place a towel between your skin and the bag. ? Leave the ice on for  20 minutes, 2-3 times per day. General instructions  Take over-the-counter and prescription medicines only as told by your health care provider.  Rest your wrist from any activity that may be causing your pain. If your condition is work related, talk with your employer about changes that can be made, such as getting a wrist pad to use while typing.  Do any exercises as told by your health care provider, physical therapist, or occupational therapist.  Keep all follow-up visits as told by your health care  provider. This is important. Contact a health care provider if:  You have new symptoms.  Your pain is not controlled with medicines.  Your symptoms get worse. Get help right away if:  You have severe numbness or tingling in your wrist or hand. Summary  Carpal tunnel syndrome is a condition that causes pain in your hand and arm.  It is usually caused by repeated wrist motions.  Lifestyle changes and medicines are used to treat carpal tunnel syndrome. Surgery may be recommended.  Follow your health care provider's instructions about wearing a splint, resting from activity, keeping follow-up visits, and calling for help. This information is not intended to replace advice given to you by your health care provider. Make sure you discuss any questions you have with your health care provider. Document Revised: 07/25/2017 Document Reviewed: 07/25/2017 Elsevier Patient Education  2020 [employer].

## 2020-03-01 NOTE — Telephone Encounter (Signed)
Patient is calling to report she has been having numbness/pain in her hands that seems to be getting worse. Bilateral, swelling and numbness. Patient states she is unable to make a fist. Call to office-  appointment scheduled  Reason for Disposition . [1] SEVERE pain (e.g., excruciating, unable to use hand at all) AND [2] not improved after 2 hours of pain medicine  Answer Assessment - Initial Assessment Questions 1. SYMPTOM: "What is the main symptom you are concerned about?" (e.g., weakness, numbness)     Both hands numb- aches and pain 2. ONSET: "When did this start?" (minutes, hours, days; while sleeping)     2 weeks 3. LAST NORMAL: "When was the last time you were normal (no symptoms)?"     2 weeks ago normal- progressively worse 4. PATTERN "Does this come and go, or has it been constant since it started?"  "Is it present now?"     Comes and goes- present now 5. CARDIAC SYMPTOMS: "Have you had any of the following symptoms: chest pain, difficulty breathing, palpitations?"     no 6. NEUROLOGIC SYMPTOMS: "Have you had any of the following symptoms: headache, dizziness, vision loss, double vision, changes in speech, unsteady on your feet?"     unsteadiness a little 7. OTHER SYMPTOMS: "Do you have any other symptoms?"     Hands are swelling- can't make fist 8. PREGNANCY: "Is there any chance you are pregnant?" "When was your last menstrual period?"     n/a  Protocols used: HAND AND WRIST PAIN-A-AH, NEUROLOGIC DEFICIT-A-AH

## 2020-03-01 NOTE — Telephone Encounter (Signed)
FYI- she's seeing you today

## 2020-03-02 ENCOUNTER — Encounter: Payer: Self-pay | Admitting: Family Medicine

## 2020-03-02 DIAGNOSIS — G5600 Carpal tunnel syndrome, unspecified upper limb: Secondary | ICD-10-CM | POA: Insufficient documentation

## 2020-03-02 DIAGNOSIS — Z03818 Encounter for observation for suspected exposure to other biological agents ruled out: Secondary | ICD-10-CM | POA: Diagnosis not present

## 2020-03-02 NOTE — Assessment & Plan Note (Addendum)
Symptoms and exam consistent with carpal tunnel. Last A1c and TSH wnl. Advised avoiding triggering movements and recommended using nighttime cock-up wrist splint and OTC ibuprofen for pain relief. F/u if no better in a few weeks.

## 2020-03-09 DIAGNOSIS — Z03818 Encounter for observation for suspected exposure to other biological agents ruled out: Secondary | ICD-10-CM | POA: Diagnosis not present

## 2020-03-21 DIAGNOSIS — Z03818 Encounter for observation for suspected exposure to other biological agents ruled out: Secondary | ICD-10-CM | POA: Diagnosis not present

## 2020-04-04 DIAGNOSIS — Z03818 Encounter for observation for suspected exposure to other biological agents ruled out: Secondary | ICD-10-CM | POA: Diagnosis not present

## 2020-07-11 ENCOUNTER — Other Ambulatory Visit: Payer: Self-pay | Admitting: Family Medicine

## 2020-07-11 NOTE — Telephone Encounter (Signed)
Last seen 12/1 by Dr Linwood Dibbles, states f/u if needed. Does she need an appt.

## 2020-07-11 NOTE — Telephone Encounter (Signed)
   Notes to clinic: Review for refill  Looks like medication was d/c in error    Requested Prescriptions  Pending Prescriptions Disp Refills   TRELEGY ELLIPTA 100-62.5-25 MCG/INH AEPB [Pharmacy Med Name: Trelegy Ellipta 100 mcg-62.5 mcg-25 mcg powder for inhalation] 60 each 6    Sig: INHALE 1 PUFF BY MOUTH INTO THE LUNGS ONCE A DAY      Off-Protocol Failed - 07/11/2020 10:39 AM      Failed - Medication not assigned to a protocol, review manually.      Passed - Valid encounter within last 12 months    Recent Outpatient Visits           4 months ago Bilateral carpal tunnel syndrome   [facility] Caro Laroche, DO   1 year ago Moderate episode of recurrent major depressive disorder [facility])   [facility] Johnson, Megan P, DO   1 year ago Moderate episode of recurrent major depressive disorder [facility])   [facility] Johnson, Megan P, DO   1 year ago Moderate episode of recurrent major depressive disorder [facility])   [facility] Johnson, Megan P, DO   2 years ago Chronic obstructive pulmonary disease, unspecified COPD type (HCC)   [facility] Johnson, Megan P, DO       Future Appointments             In 1 week [facility], [facility]

## 2020-07-12 NOTE — Telephone Encounter (Signed)
Due for appt.

## 2020-07-12 NOTE — Telephone Encounter (Signed)
LVM TO MAKE THIS APT  

## 2020-07-13 ENCOUNTER — Ambulatory Visit: Payer: Medicare Other

## 2020-07-13 NOTE — Telephone Encounter (Signed)
LVM TO MAKE THIS APT  

## 2020-07-17 ENCOUNTER — Encounter: Payer: Self-pay | Admitting: Family Medicine

## 2020-07-17 NOTE — Telephone Encounter (Signed)
LVM TO MAKE THIS APT  sent letter

## 2020-07-21 ENCOUNTER — Ambulatory Visit (INDEPENDENT_AMBULATORY_CARE_PROVIDER_SITE_OTHER): Payer: Medicare Other

## 2020-07-21 VITALS — Ht 62.0 in | Wt 142.0 lb

## 2020-07-21 DIAGNOSIS — Z0000 Encounter for general adult medical examination without abnormal findings: Secondary | ICD-10-CM

## 2020-07-21 NOTE — Progress Notes (Signed)
Subjective:   Andrea Kane is a 50 y.o. female who presents for Medicare Annual (Subsequent) preventive examination.  Review of Systems     Cardiac Risk Factors include: sedentary lifestyle     Objective:    Today's Vitals   07/21/20 1258  Weight: 142 lb (64.4 kg)  Height: 5\' 2"  (1.575 m)   Body mass index is 25.97 kg/m.  Advanced Directives 07/21/2020 10/08/2016 02/29/2016 02/29/2016  Does Patient Have a Medical Advance Directive? No No No No  Would patient like information on creating a medical advance directive? - No - Patient declined No - Patient declined No - Patient declined    Current Medications (verified) Outpatient Encounter Medications as of 07/21/2020  Medication Sig  . albuterol (PROVENTIL HFA;VENTOLIN HFA) 108 (90 Base) MCG/ACT inhaler Inhale 2 puffs into the lungs every 6 (six) hours as needed for wheezing or shortness of breath.  . traMADol (ULTRAM) 50 MG tablet Take 50-100 mg by mouth 4 (four) times daily as needed.  . traZODone (DESYREL) 50 MG tablet Take 100 mg by mouth at bedtime.  . ALPRAZolam (XANAX) 0.25 MG tablet Take 0.25 mg by mouth 4 (four) times daily. (Patient not taking: Reported on 07/21/2020)  . Buprenorphine HCl-Naloxone HCl 8-2 MG FILM Place 3 Film under the tongue daily.  (Patient not taking: Reported on 07/21/2020)   No facility-administered encounter medications on file as of 07/21/2020.    Allergies (verified) Patient has no known allergies.   History: Past Medical History:  Diagnosis Date  . Anxiety   . COPD (chronic obstructive pulmonary disease) (HCC)   . Ectopic pregnancy    Past Surgical History:  Procedure Laterality Date  . BACK SURGERY     X3  . OOPHORECTOMY Left    Family History  Problem Relation Age of Onset  . Diabetes Mother   . Hypertension Father   . COPD Sister   . Drug abuse Sister   . Cerebral palsy Son   . Alzheimer's disease Maternal Grandmother   . Brain cancer Maternal Grandmother   . Hypertension  Paternal Grandfather   . Hyperlipidemia Paternal Grandfather   . Cancer Other 16       cervical  . Breast cancer Maternal Aunt 55  . Stomach cancer Maternal Aunt    Social History   Socioeconomic History  . Marital status: Single    Spouse name: Not on file  . Number of children: Not on file  . Years of education: Not on file  . Highest education level: Not on file  Occupational History  . Occupation: disability   Tobacco Use  . Smoking status: Former Smoker    Types: Cigars  . Smokeless tobacco: Never Used  . Tobacco comment: over 6-7 years ago   Vaping Use  . Vaping Use: Former  . Substances: CBD, Mixture of cannabinoids  Substance and Sexual Activity  . Alcohol use: No  . Drug use: Not Currently  . Sexual activity: Not Currently    Birth control/protection: None  Other Topics Concern  . Not on file  Social History Narrative  . Not on file   Social Determinants of Health   Financial Resource Strain: Low Risk   . Difficulty of Paying Living Expenses: Not hard at all  Food Insecurity: No Food Insecurity  . Worried About 07/23/2020 in the Last Year: Never true  . Ran Out of Food in the Last Year: Never true  Transportation Needs: No Transportation Needs  .  Lack of Transportation (Medical): No  . Lack of Transportation (Non-Medical): No  Physical Activity: Inactive  . Days of Exercise per Week: 0 days  . Minutes of Exercise per Session: 0 min  Stress: No Stress Concern Present  . Feeling of Stress : Only a little  Social Connections: Not on file    Tobacco Counseling Counseling given: Not Answered Comment: over 6-7 years ago    Clinical Intake:  Pre-visit preparation completed: Yes  Pain : No/denies pain     Nutritional Status: BMI 25 -29 Overweight Nutritional Risks: Nausea/ vomitting/ diarrhea (nausea for past week) Diabetes: No  How often do you need to have someone help you when you read instructions, pamphlets, or other written  materials from your doctor or pharmacy?: 1 - Never What is the last grade level you completed in school?: 9th grade  Diabetic? no  Interpreter Needed?: No  Information entered by :: NAllen LPN   Activities of Daily Living In your present state of health, do you have any difficulty performing the following activities: 07/21/2020  Hearing? Y  Vision? Y  Comment can't see close up  Difficulty concentrating or making decisions? Y  Walking or climbing stairs? Y  Comment gets winded  Dressing or bathing? N  Doing errands, shopping? N  Preparing Food and eating ? N  Using the Toilet? N  In the past six months, have you accidently leaked urine? N  Do you have problems with loss of bowel control? N  Managing your Medications? N  Managing your Finances? N  Housekeeping or managing your Housekeeping? N  Some recent data might be hidden    Patient Care Team: Dorcas Carrow, DO as PCP - General (Family Medicine) [place], Valora Corporal, DO as Referring Physician (Behavioral Health) Minor, Theadora Rama, RN (Inactive) as [facility] Care Management Gustavus Bryant, LCSW as Social Worker (Licensed Visual merchandiser)  Indicate any recent [employer] you may have received from other than [facility] providers in the past year (date may be approximate).     Assessment:   This is a routine wellness examination for Andrea Kane.  Hearing/Vision screen No exam data present  Dietary issues and exercise activities discussed: Current Exercise Habits: The patient does not participate in regular exercise at present  Goals    .  "I have daily panic attacks and I need some relief" (pt-stated)      Current Barriers:  . Financial constraints . Limited social support . Mental Health Concerns  . Social Isolation . Limited education about anxiety management tools to implement into her daily life to combat panic attacks* . Lacks knowledge of community resource: available mental health support  resources within the area  Clinical Social Work Clinical Goal(s):  Marland Kitchen Over the next 90 days, client will work with SW to address concerns related to gaining appropriate mental health support . Over the next 90 days, client will follow up and investigate mailed mental health resources as directed by LCSW  Interventions: . Patient interviewed and appropriate assessments performed . Provided mental health counseling with regard to anxiety management. Educated patient on coping methods to implement into her daily life to combat depressive symptoms, anxiety attacks and stress. Patient denied any suicidal or homicidal ideations. Encouraged patient to implement deep breathing exercises into her daily routine due to ongoing anxiety . Provided patient with information about available mental health resources within the area . Discussed plans with patient for ongoing care management follow up and provided patient  with direct contact information for care management team . Advised patient to check her mailbox for resources that LCSW will sent out today on 10/16/2018 but patient shares that she did not receive these. LCSW will sent out entire packet again on 11/18/2018 as patient prefers this over [employer]ing resource content.  . Assisted patient/caregiver with obtaining information about health plan benefits . Provided education to patient/caregiver regarding level of care options as well as available mental health resources within the local area.   Patient Self Care Activities:  . Attends all scheduled provider appointments . Calls provider office for new concerns or questions  Please see past updates related to this goal by clicking on the "Past Updates" button in the selected goal      .  I want to get healthier, wt loss and exercise (pt-stated)      Current Barriers:  Marland Kitchen. Knowledge Deficits related to weight loss and starting an exercise program . Financial Constraints.  . Transportation barriers . Needs  for dental work . Stress in the home  Nurse Case Manager Clinical Goal(s):  Marland Kitchen. Over the next 90 days, [facility]  to address needs related to obtaining a healthier lifestyle  Interventions:  . Discussed plans with patient for ongoing care management follow up and provided patient with direct contact information for care management team . Care Guide referral for transportation limitations  . Social Work referral for stress reduction/management  techniques . Discussed ways to incorporate healthier foods into patient's diet. . Discussed patient's goals and ways to achieve goals. (wants to lose 30lbs) . Plan to mail patient education on diet and exercise.   Patient Self Care Activities:  . Performs ADL's independently . Performs IADL's independently  Initial goal documentation     .  Patient Stated      07/21/2020, wants to be able to walk more without getting winded      Depression Screen PHQ 2/9 Scores 07/21/2020 07/12/2019 12/09/2018 09/14/2018 08/14/2018 07/10/2018 09/15/2017  PHQ - 2 Score 0 0 3 2 5 3 3   PHQ- 9 Score - - 14 14 11 14 11     Fall Risk Fall Risk  07/21/2020 07/12/2019 08/14/2018 07/04/2017  Falls in the past year? 0 0 1 Yes  Number falls in past yr: - 0 0 1  Injury with Fall? - 0 1 No  Risk for fall due to : Medication side effect - - -  Follow up Falls evaluation completed;Education provided;Falls prevention discussed - - -    FALL RISK PREVENTION PERTAINING TO THE HOME:  Any stairs in or around the home? Yes  If so, are there any without handrails? No  Home free of loose throw rugs in walkways, pet beds, electrical cords, etc? Yes  Adequate lighting in your home to reduce risk of falls? Yes   ASSISTIVE DEVICES UTILIZED TO PREVENT FALLS:  Life alert? No  Use of a cane, walker or w/c? No  Grab bars in the bathroom? No  Shower chair or bench in shower? No  Elevated toilet seat or a handicapped toilet? No   TIMED UP AND GO:  Was the test  performed? No . .   Cognitive Function:     6CIT Screen 07/21/2020  What Year? 0 points  What month? 0 points  What time? 0 points  Count back from 20 0 points  Months in reverse 0 points  Repeat phrase 2 points  Total Score 2    Immunizations Immunization History  Administered Date(s) Administered  . Influenza,inj,Quad PF,6+ Mos 03/01/2016  . Pneumococcal Polysaccharide-23 03/01/2016  . Tdap 07/04/2017    TDAP status: Up to date  Flu Vaccine status: Due, Education has been provided regarding the importance of this vaccine. Advised may receive this vaccine at local pharmacy or Health Dept. Aware to provide a copy of the vaccination record if obtained from local pharmacy or Health Dept. Verbalized acceptance and understanding.  Pneumococcal vaccine status: Up to date  Covid-19 vaccine status: Declined, Education has been provided regarding the importance of this vaccine but patient still declined. Advised may receive this vaccine at local pharmacy or Health Dept.or vaccine clinic. Aware to provide a copy of the vaccination record if obtained from local pharmacy or Health Dept. Verbalized acceptance and understanding.  Qualifies for Shingles Vaccine? No   Zostavax completed n/a  Shingrix Completed?: n/a  Screening Tests Health Maintenance  Topic Date Due  . Hepatitis C Screening  Never done  . COVID-19 Vaccine (1) Never done  . COLONOSCOPY (Pts 45-55yrs Insurance coverage will need to be confirmed)  Never done  . INFLUENZA VACCINE  10/30/2020  . PAP SMEAR-Modifier  09/21/2021  . TETANUS/TDAP  07/05/2027  . HIV Screening  Completed  . HPV VACCINES  Aged Out    Health Maintenance  Health Maintenance Due  Topic Date Due  . Hepatitis C Screening  Never done  . COVID-19 Vaccine (1) Never done  . COLONOSCOPY (Pts 45-25yrs Insurance coverage will need to be confirmed)  Never done    Colorectal cancer screening: due  Mammogram status: due  Bone Density status:  n/a  Lung Cancer Screening: (Low Dose CT Chest recommended if Age 41-80 years, 30 pack-year currently smoking OR have quit w/in 15years.) does not qualify.   Lung Cancer Screening Referral: no  Additional Screening:  Hepatitis C Screening: does qualify; due  Vision Screening: Recommended annual ophthalmology exams for early detection of glaucoma and other disorders of the eye. Is the patient up to date with their annual eye exam?  No  Who is the provider or what is the name of the office in which the patient attends annual eye exams? Can't remember name If pt is not established with a provider, would they like to be referred to a provider to establish care? No .   Dental Screening: Recommended annual dental exams for proper oral hygiene  Community Resource Referral / Chronic Care Management: CRR required this visit?  No   CCM required this visit?  No      Plan:     I have personally reviewed and noted the following in the patient's chart:   . Medical and social history . Use of alcohol, tobacco or illicit drugs  . Current medications and supplements . Functional ability and status . Nutritional status . Physical activity . Advanced directives . List of other physicians . Hospitalizations, surgeries, and ER visits in previous 12 months . Vitals . Screenings to include cognitive, depression, and falls . Referrals and appointments  In addition, I have reviewed and discussed with patient certain preventive protocols, quality metrics, and best practice recommendations. A written personalized care plan for preventive services as well as general preventive health recommendations were provided to patient.     Barb Merino, LPN   [id number]   Nurse Notes:

## 2020-07-21 NOTE — Patient Instructions (Signed)
Andrea Kane , Thank you for taking time to come for your Medicare Wellness Visit. I appreciate your ongoing commitment to your health goals. Please review the following plan we discussed and let me know if I can assist you in the future.   Screening recommendations/referrals: Colonoscopy: due Mammogram: due Bone Density: n/a Recommended yearly ophthalmology/optometry visit for glaucoma screening and checkup Recommended yearly dental visit for hygiene and checkup  Vaccinations: Influenza vaccine: due 10/30/2020 Pneumococcal vaccine: completed 03/01/2016 Tdap vaccine: completed 07/04/2017, due4/07/2027 Shingles vaccine: n/a  Covid-19: decline  Advanced directives: Advance directive discussed with you today.   Conditions/risks identified: none  Next appointment: Follow up in one year for your annual wellness visit.   Preventive Care 40-64 Years, Female Preventive care refers to lifestyle choices and visits with your health care provider that can promote health and wellness. What does preventive care include?  A yearly physical exam. This is also called an annual well check.  Dental exams once or twice a year.  Routine eye exams. Ask your health care provider how often you should have your eyes checked.  Personal lifestyle choices, including:  Daily care of your teeth and gums.  Regular physical activity.  Eating a healthy diet.  Avoiding tobacco and drug use.  Limiting alcohol use.  Practicing safe sex.  Taking low-dose aspirin daily starting at age 64.  Taking vitamin and mineral supplements as recommended by your health care provider. What happens during an annual well check? The services and screenings done by your health care provider during your annual well check will depend on your age, overall health, lifestyle risk factors, and family history of disease. Counseling  Your health care provider may ask you questions about your:  Alcohol use.  Tobacco use.  Drug  use.  Emotional well-being.  Home and relationship well-being.  Sexual activity.  Eating habits.  Work and work Statistician.  Method of birth control.  Menstrual cycle.  Pregnancy history. Screening  You may have the following tests or measurements:  Height, weight, and BMI.  Blood pressure.  Lipid and cholesterol levels. These may be checked every 5 years, or more frequently if you are over 42 years old.  Skin check.  Lung cancer screening. You may have this screening every year starting at age 25 if you have a 30-pack-year history of smoking and currently smoke or have quit within the past 15 years.  Fecal occult blood test (FOBT) of the stool. You may have this test every year starting at age 58.  Flexible sigmoidoscopy or colonoscopy. You may have a sigmoidoscopy every 5 years or a colonoscopy every 10 years starting at age 27.  Hepatitis C blood test.  Hepatitis B blood test.  Sexually transmitted disease (STD) testing.  Diabetes screening. This is done by checking your blood sugar (glucose) after you have not eaten for a while (fasting). You may have this done every 1-3 years.  Mammogram. This may be done every 1-2 years. Talk to your health care provider about when you should start having regular mammograms. This may depend on whether you have a family history of breast cancer.  BRCA-related cancer screening. This may be done if you have a family history of breast, ovarian, tubal, or peritoneal cancers.  Pelvic exam and Pap test. This may be done every 3 years starting at age 67. Starting at age 69, this may be done every 5 years if you have a Pap test in combination with an HPV test.  Bone density  scan. This is done to screen for osteoporosis. You may have this scan if you are at high risk for osteoporosis. Discuss your test results, treatment options, and if necessary, the need for more tests with your health care provider. Vaccines  Your health care  provider may recommend certain vaccines, such as:  Influenza vaccine. This is recommended every year.  Tetanus, diphtheria, and acellular pertussis (Tdap, Td) vaccine. You may need a Td booster every 10 years.  Zoster vaccine. You may need this after age 25.  Pneumococcal 13-valent conjugate (PCV13) vaccine. You may need this if you have certain conditions and were not previously vaccinated.  Pneumococcal polysaccharide (PPSV23) vaccine. You may need one or two doses if you smoke cigarettes or if you have certain conditions. Talk to your health care provider about which screenings and vaccines you need and how often you need them. This information is not intended to replace advice given to you by your health care provider. Make sure you discuss any questions you have with your health care provider. Document Released: 04/14/2015 Document Revised: 12/06/2015 Document Reviewed: 01/17/2015 Elsevier Interactive Patient Education  2017 [place] Prevention in the Home Falls can cause injuries. They can happen to people of all ages. There are many things you can do to make your home safe and to help prevent falls. What can I do on the outside of my home?  Regularly fix the edges of walkways and driveways and fix any cracks.  Remove anything that might make you trip as you walk through a door, such as a raised step or threshold.  Trim any bushes or trees on the path to your home.  Use bright outdoor lighting.  Clear any walking paths of anything that might make someone trip, such as rocks or tools.  Regularly check to see if handrails are loose or broken. Make sure that both sides of any steps have handrails.  Any raised decks and porches should have guardrails on the edges.  Have any leaves, snow, or ice cleared regularly.  Use sand or salt on walking paths during winter.  Clean up any spills in your garage right away. This includes oil or grease spills. What can I do  in the bathroom?  Use night lights.  Install grab bars by the toilet and in the tub and shower. Do not use towel bars as grab bars.  Use non-skid mats or decals in the tub or shower.  If you need to sit down in the shower, use a plastic, non-slip stool.  Keep the floor dry. Clean up any water that spills on the floor as soon as it happens.  Remove soap buildup in the tub or shower regularly.  Attach bath mats securely with double-sided non-slip rug tape.  Do not have throw rugs and other things on the floor that can make you trip. What can I do in the bedroom?  Use night lights.  Make sure that you have a light by your bed that is easy to reach.  Do not use any sheets or blankets that are too big for your bed. They should not hang down onto the floor.  Have a firm chair that has side arms. You can use this for support while you get dressed.  Do not have throw rugs and other things on the floor that can make you trip. What can I do in the kitchen?  Clean up any spills right away.  Avoid walking on wet floors.  Keep items that you use a lot in easy-to-reach places.  If you need to reach something above you, use a strong step stool that has a grab bar.  Keep electrical cords out of the way.  Do not use floor polish or wax that makes floors slippery. If you must use wax, use non-skid floor wax.  Do not have throw rugs and other things on the floor that can make you trip. What can I do with my stairs?  Do not leave any items on the stairs.  Make sure that there are handrails on both sides of the stairs and use them. Fix handrails that are broken or loose. Make sure that handrails are as long as the stairways.  Check any carpeting to make sure that it is firmly attached to the stairs. Fix any carpet that is loose or worn.  Avoid having throw rugs at the top or bottom of the stairs. If you do have throw rugs, attach them to the floor with carpet tape.  Make sure that you  have a light switch at the top of the stairs and the bottom of the stairs. If you do not have them, ask someone to add them for you. What else can I do to help prevent falls?  Wear shoes that:  Do not have high heels.  Have rubber bottoms.  Are comfortable and fit you well.  Are closed at the toe. Do not wear sandals.  If you use a stepladder:  Make sure that it is fully opened. Do not climb a closed stepladder.  Make sure that both sides of the stepladder are locked into place.  Ask someone to hold it for you, if possible.  Clearly mark and make sure that you can see:  Any grab bars or handrails.  First and last steps.  Where the edge of each step is.  Use tools that help you move around (mobility aids) if they are needed. These include:  Canes.  Walkers.  Scooters.  Crutches.  Turn on the lights when you go into a dark area. Replace any light bulbs as soon as they burn out.  Set up your furniture so you have a clear path. Avoid moving your furniture around.  If any of your floors are uneven, fix them.  If there are any pets around you, be aware of where they are.  Review your medicines with your doctor. Some medicines can make you feel dizzy. This can increase your chance of falling. Ask your doctor what other things that you can do to help prevent falls. This information is not intended to replace advice given to you by your health care provider. Make sure you discuss any questions you have with your health care provider. Document Released: 01/12/2009 Document Revised: 08/24/2015 Document Reviewed: 04/22/2014 Elsevier Interactive Patient Education  2017 [employer].

## 2020-07-24 NOTE — Progress Notes (Signed)
I connected with Donja Tipping today by telephone and verified that I am speaking with the correct person using two identifiers. Location patient: home Location provider: work Persons participating in the virtual visit: Yona Kosek, Elisha Ponder LPN.   I discussed the limitations, risks, security and privacy concerns of performing an evaluation and management service by telephone and the availability of in person appointments. I also discussed with the patient that there may be a patient responsible charge related to this service. The patient expressed understanding and verbally consented to this telephonic visit.    Interactive audio and video telecommunications were attempted between this provider and patient, however failed, due to patient having technical difficulties OR patient did not have access to video capability.  We continued and completed visit with audio only.     Vital signs may be patient reported or missing.

## 2020-07-26 ENCOUNTER — Other Ambulatory Visit: Payer: Self-pay | Admitting: Family Medicine

## 2020-07-26 NOTE — Telephone Encounter (Signed)
Requested medication (s) are due for refill today:   Not on list  Requested medication (s) are on the active medication list:   Not on list D/C 03/01/2020  Future visit scheduled:   No    Last ordered: 03/01/2020  Returned for provider review for refill or not      Requested Prescriptions  Pending Prescriptions Disp Refills   TRELEGY ELLIPTA 100-62.5-25 MCG/INH AEPB [Pharmacy Med Name: Trelegy Ellipta 100 mcg-62.5 mcg-25 mcg powder for inhalation] 60 each 6    Sig: INHALE 1 PUFF BY MOUTH INTO THE LUNGS ONCE A DAY      Off-Protocol Failed - 07/26/2020 12:09 PM      Failed - Medication not assigned to a protocol, review manually.      Passed - Valid encounter within last 12 months    Recent Outpatient Visits           4 months ago Bilateral carpal tunnel syndrome   [facility] Caro Laroche, DO   1 year ago Moderate episode of recurrent major depressive disorder [facility])   [facility] Johnson, Megan P, DO   1 year ago Moderate episode of recurrent major depressive disorder [facility])   [facility] Johnson, Megan P, DO   1 year ago Moderate episode of recurrent major depressive disorder [facility])   [facility] Johnson, Megan P, DO   2 years ago Chronic obstructive pulmonary disease, unspecified COPD type (HCC)   [facility] Johnson, Megan P, DO       Future Appointments             In 12 months [facility]

## 2020-07-26 NOTE — Telephone Encounter (Signed)
Unsure if pt needs an appt medication was discontinued

## 2020-07-28 NOTE — Telephone Encounter (Signed)
Pt scheduled 5/9

## 2020-08-07 ENCOUNTER — Telehealth: Payer: Medicare Other | Admitting: Family Medicine

## 2020-11-08 ENCOUNTER — Other Ambulatory Visit: Payer: Self-pay

## 2020-11-08 ENCOUNTER — Encounter: Payer: Self-pay | Admitting: Nurse Practitioner

## 2020-11-08 ENCOUNTER — Ambulatory Visit (INDEPENDENT_AMBULATORY_CARE_PROVIDER_SITE_OTHER): Payer: Medicare Other | Admitting: Nurse Practitioner

## 2020-11-08 ENCOUNTER — Ambulatory Visit: Payer: Self-pay | Admitting: *Deleted

## 2020-11-08 VITALS — BP 124/82 | HR 90 | Temp 97.3°F | Wt 143.0 lb

## 2020-11-08 DIAGNOSIS — H6690 Otitis media, unspecified, unspecified ear: Secondary | ICD-10-CM | POA: Diagnosis not present

## 2020-11-08 MED ORDER — AMOXICILLIN-POT CLAVULANATE 875-125 MG PO TABS: 1.0000 | ORAL_TABLET | Freq: Two times a day (BID) | ORAL | 0 refills | Status: DC

## 2020-11-08 MED ORDER — FLUCONAZOLE 150 MG PO TABS: 150.0000 mg | ORAL_TABLET | Freq: Once | ORAL | 0 refills | Status: AC

## 2020-11-08 NOTE — Telephone Encounter (Signed)
Pt called back after call had been dropped. Reports ear pain, right ear x 2 days. States "40/10" pain. Reports small amount of clear drainage from ear at times. Denies fever. States lymph nodes right side of neck swollen. Has been taking IBU 800mg , "Helps some, but pain comes back." States feels like ear is swollen, hearing affected. Appt made with at 1400 today. DT screening completed. Care advise given. Advised ED for worsening symptoms. Pt verbalizes understanding. Correct phone number changed on demographics.  Reason for Disposition . [1] SEVERE pain AND [2] not improved 2 hours after taking analgesic medication (e.g., ibuprofen or acetaminophen)  Answer Assessment - Initial Assessment Questions 1. LOCATION: "Which ear is involved?"     right 2. ONSET: "When did the ear start hurting"      2 days ago 3. SEVERITY: "How bad is the pain?"  (Scale 1-10; mild, moderate or severe)   - MILD (1-3): doesn't interfere with normal activities    - MODERATE (4-7): interferes with normal activities or awakens from sleep    - SEVERE (8-10): excruciating pain, unable to do any normal activities      40/10 4. URI SYMPTOMS: "Do you have a runny nose or cough?"     no 5. FEVER: "Do you have a fever?" If Yes, ask: "What is your temperature, how was it measured, and when did it start?"     unsure 6. CAUSE: "Have you been swimming recently?", "How often do you use Q-TIPS?", "Have you had any recent air travel or scuba diving?"     no 7. OTHER SYMPTOMS: "Do you have any other symptoms?" (e.g., headache, stiff neck, dizziness, vomiting, runny nose, decreased hearing)     Lymph nodes swollen on right side neck  Protocols used: Earache-A-AH

## 2020-11-08 NOTE — Telephone Encounter (Signed)
Call disconnected when transferred to triage. Attempted to call back pt. "Number not in service or disconnected."

## 2020-11-08 NOTE — Progress Notes (Signed)
BP 124/82   Pulse 90   Temp (!) 97.3 F (36.3 C)   Wt 143 lb (64.9 kg) Comment: Patient reported  SpO2 99%   BMI 26.16 kg/m    Subjective:    Patient ID: Andrea Kane, female    DOB: 1970/10/11, 50 y.o.   MRN: [id number]  HPI: Andrea Kane is a 50 y.o. female  Chief Complaint  Patient presents with   Ear Pain    Right    EAR PAIN Duration:  2 days ago Involved ear(s): right Severity:  severe  Quality:  sharp Fever: no Otorrhea: yes Upper respiratory infection symptoms: no Pruritus: yes Hearing loss: yes Water immersion no Using Q-tips: yes Recurrent otitis media: no Status: worse Treatments attempted: none  Relevant past medical, surgical, family and social history reviewed and updated as indicated. Interim medical history since our last visit reviewed. Allergies and medications reviewed and updated.  Review of Systems  HENT:  Positive for ear pain.        Swollen lymph node   Per HPI unless specifically indicated above     Objective:    BP 124/82   Pulse 90   Temp (!) 97.3 F (36.3 C)   Wt 143 lb (64.9 kg) Comment: Patient reported  SpO2 99%   BMI 26.16 kg/m   Wt Readings from Last 3 Encounters:  11/08/20 143 lb (64.9 kg)  07/21/20 142 lb (64.4 kg)  03/01/20 151 lb 2 oz (68.5 kg)    Physical Exam Vitals and nursing note reviewed.  Constitutional:      General: She is not in acute distress.    Appearance: Normal appearance. She is normal weight. She is not ill-appearing, toxic-appearing or diaphoretic.  HENT:     Head: Normocephalic.     Right Ear: Swelling and tenderness present. A middle ear effusion is present. There is mastoid tenderness. Tympanic membrane is erythematous.     Left Ear: Tympanic membrane and external ear normal.  No middle ear effusion.     Nose: Nose normal.     Mouth/Throat:     Mouth: Mucous membranes are moist.     Pharynx: Oropharynx is clear.  Eyes:     General:        Right eye: No discharge.        Left  eye: No discharge.     Extraocular Movements: Extraocular movements intact.     Conjunctiva/sclera: Conjunctivae normal.     Pupils: Pupils are equal, round, and reactive to light.  Cardiovascular:     Rate and Rhythm: Normal rate and regular rhythm.     Heart sounds: No murmur heard. Pulmonary:     Effort: Pulmonary effort is normal. No respiratory distress.     Breath sounds: Normal breath sounds. No wheezing or rales.  Musculoskeletal:     Cervical back: Normal range of motion and neck supple.  Skin:    General: Skin is warm and dry.     Capillary Refill: Capillary refill takes less than 2 seconds.  Neurological:     General: No focal deficit present.     Mental Status: She is alert and oriented to person, place, and time. Mental status is at baseline.  Psychiatric:        Mood and Affect: Mood normal.        Behavior: Behavior normal.        Thought Content: Thought content normal.        Judgment: Judgment normal.  Results for orders placed or performed in visit on 09/22/18  Cytology - PAP  Result Value Ref Range   Adequacy      Satisfactory for evaluation  endocervical/transformation zone component PRESENT.   Diagnosis      NEGATIVE FOR INTRAEPITHELIAL LESIONS OR MALIGNANCY. BENIGN REACTIVE/REPARATIVE CHANGES.   HPV DETECTED (A)    Material Submitted CervicoVaginal Pap [ThinPrep Imaged]    CYTOLOGY - PAP PAP RESULT       Assessment & Plan:   Problem List Items Addressed This Visit   None Visit Diagnoses     Acute otitis media, unspecified otitis media type    -  Primary   Complete course of antibiotics. Discussed s/s to monitor for.  Follow up if symptoms worsen or fail to improve.    Relevant Medications   amoxicillin-clavulanate (AUGMENTIN) 875-125 MG tablet   fluconazole (DIFLUCAN) 150 MG tablet        Follow up plan: Return if symptoms worsen or fail to improve.

## 2020-12-11 ENCOUNTER — Ambulatory Visit: Payer: Medicare Other | Admitting: Nurse Practitioner

## 2020-12-11 NOTE — Progress Notes (Deleted)
   There were no vitals taken for this visit.   Subjective:    Patient ID: Andrea Kane, female    DOB: June 06, 1970, 50 y.o.   MRN: [id number]  HPI: Andrea Kane is a 50 y.o. female  No chief complaint on file.  EAR PROBLEM   Relevant past medical, surgical, family and social history reviewed and updated as indicated. Interim medical history since our last visit reviewed. Allergies and medications reviewed and updated.  Review of Systems  Per HPI unless specifically indicated above     Objective:    There were no vitals taken for this visit.  Wt Readings from Last 3 Encounters:  11/08/20 143 lb (64.9 kg)  07/21/20 142 lb (64.4 kg)  03/01/20 151 lb 2 oz (68.5 kg)    Physical Exam  Results for orders placed or performed in visit on 09/22/18  Cytology - PAP  Result Value Ref Range   Adequacy      Satisfactory for evaluation  endocervical/transformation zone component PRESENT.   Diagnosis      NEGATIVE FOR INTRAEPITHELIAL LESIONS OR MALIGNANCY. BENIGN REACTIVE/REPARATIVE CHANGES.   HPV DETECTED (A)    Material Submitted CervicoVaginal Pap [ThinPrep Imaged]    CYTOLOGY - PAP PAP RESULT       Assessment & Plan:   Problem List Items Addressed This Visit   None    Follow up plan: No follow-ups on file.

## 2021-07-23 ENCOUNTER — Ambulatory Visit: Payer: Medicare Other

## 2021-08-09 ENCOUNTER — Other Ambulatory Visit: Payer: Self-pay | Admitting: Family Medicine

## 2021-08-09 NOTE — Telephone Encounter (Signed)
Routing to provider. Does the patient need to discuss this before ext appointment ?

## 2021-08-09 NOTE — Telephone Encounter (Signed)
Medication Refill - Medication: TRELEGY ELLIPTA 100-62.5-25 MCG/INH AEPB  ? ?Has the patient contacted their pharmacy? Yes.   No, more refills. ? ?(Agent: If yes, when and what did the pharmacy advise?) ? ?Preferred Pharmacy (with phone number or street name):  ?[id number] [place], [place] - [address] AT [facility]  ?[address] [place] [address]  ?Phone: [phone] Fax: [phone]  ?Hours: Not open 24 hours  ? ?Has the patient been seen for an appointment in the last year OR does the patient have an upcoming appointment? No. ? ?Agent: Please be advised that RX refills may take up to 3 business days. We ask that you follow-up with your pharmacy.  ?

## 2021-08-31 ENCOUNTER — Ambulatory Visit: Payer: Self-pay

## 2021-08-31 ENCOUNTER — Ambulatory Visit: Payer: Medicare Other | Admitting: Physician Assistant

## 2021-08-31 MED ORDER — ALBUTEROL SULFATE HFA 108 (90 BASE) MCG/ACT IN AERS: 2.0000 | INHALATION_SPRAY | Freq: Four times a day (QID) | RESPIRATORY_TRACT | 0 refills | Status: DC | PRN

## 2021-08-31 NOTE — Progress Notes (Deleted)
Established Patient Office Visit  Name: Andrea Kane   MRN: [id number]    DOB: 02/20/1971   Date:08/31/2021  Today's Provider: Jacquelin Hawking, MHS, PA-C Introduced myself to the patient as a PA-C and provided education on APPs in clinical practice.         Subjective  Chief Complaint  No chief complaint on file.   HPI   Patient Active Problem List   Diagnosis Date Noted   Carpal tunnel syndrome 03/02/2020   COPD (chronic obstructive pulmonary disease) (HCC) 07/04/2017   Chronic low back pain 07/04/2017   Major depression 07/04/2017   Opiate abuse, continuous (HCC) 07/04/2017    Past Surgical History:  Procedure Laterality Date   BACK SURGERY     X3   OOPHORECTOMY Left     Family History  Problem Relation Age of Onset   Diabetes Mother    Hypertension Father    COPD Sister    Drug abuse Sister    Cerebral palsy Son    Alzheimer's disease Maternal Grandmother    Brain cancer Maternal Grandmother    Hypertension Paternal Grandfather    Hyperlipidemia Paternal Grandfather    Cancer Other 16       cervical   Breast cancer Maternal Aunt 55   Stomach cancer Maternal Aunt     Social History   Tobacco Use   Smoking status: Former    Types: Cigars   Smokeless tobacco: Never   Tobacco comments:    over 6-7 years ago   Substance Use Topics   Alcohol use: No     Current Outpatient Medications:    albuterol (PROVENTIL HFA;VENTOLIN HFA) 108 (90 Base) MCG/ACT inhaler, Inhale 2 puffs into the lungs every 6 (six) hours as needed for wheezing or shortness of breath., Disp: 1 Inhaler, Rfl: 0   ALPRAZolam (XANAX) 0.25 MG tablet, Take 0.25 mg by mouth 4 (four) times daily. (Patient not taking: Reported on 07/21/2020), Disp: , Rfl:    amoxicillin-clavulanate (AUGMENTIN) 875-125 MG tablet, Take 1 tablet by mouth 2 (two) times daily., Disp: 20 tablet, Rfl: 0   Buprenorphine HCl-Naloxone HCl 8-2 MG FILM, Place 3 Film under the tongue daily.  (Patient not taking:  Reported on 07/21/2020), Disp: , Rfl: 0   traMADol (ULTRAM) 50 MG tablet, Take 50-100 mg by mouth 4 (four) times daily as needed., Disp: , Rfl:    traZODone (DESYREL) 50 MG tablet, Take 100 mg by mouth at bedtime., Disp: , Rfl: 5  No Known Allergies  I personally reviewed {Reviewed:14835} with the patient/caregiver today.   ROS    Objective  There were no vitals filed for this visit.  There is no height or weight on file to calculate BMI.  Physical Exam   No results found for this or any previous visit (from the past 2160 hour(s)).   PHQ2/9:    07/21/2020    1:07 PM 07/12/2019    1:06 PM 12/09/2018   10:47 AM 09/14/2018    1:49 PM 08/14/2018   12:49 PM  Depression screen PHQ 2/9  Decreased Interest 0 0 3 2 3   Down, Depressed, Hopeless 0 0 0 0 2  PHQ - 2 Score 0 0 3 2 5   Altered sleeping   0 1 0  Tired, decreased energy   3 3 3   Change in appetite   3 2 0  Feeling bad or failure about yourself    2 2 1   Trouble  concentrating   3 3 2   Moving slowly or fidgety/restless   0 1 0  Suicidal thoughts   0 0 0  PHQ-9 Score   14 14 11   Difficult doing work/chores   Very difficult Somewhat difficult Somewhat difficult      Fall Risk:    07/21/2020    1:06 PM 07/12/2019    1:04 PM 08/14/2018   12:48 PM 07/04/2017   10:17 AM  Fall Risk   Falls in the past year? 0 0 1 Yes  Number falls in past yr:  0 0 1  Injury with Fall?  0 1 No  Risk for fall due to : Medication side effect     Follow up Falls evaluation completed;Education provided;Falls prevention discussed         Functional Status Survey:      Assessment & Plan

## 2021-08-31 NOTE — Telephone Encounter (Signed)
Please advise, patient had appointment today and did not show. Rescheduled to Monday, has not been seen since August 2022 for acute visit.

## 2021-08-31 NOTE — Telephone Encounter (Signed)
     Chief Complaint: Pt. Missed appointment today. Appointment made for Monday. Asking for refill on Albuterol inhaler. Symptoms: SOB, wheezing Frequency: "For awhile." Pertinent Negatives: Patient denies chest pain Disposition: [] ED /[] Urgent Care (no appt availability in office) / [] Appointment(In office/virtual)/ []  [facility]/ [] Home Care/ [] Refused Recommended Disposition /[]  Mobile Bus/ [x]  Follow-up with PCP Additional Notes: Asking for refill on Albuterol inhaler.Please advise pt.  Reason for Disposition  [1] MODERATE longstanding difficulty breathing (e.g., speaks in phrases, SOB even at rest, pulse 100-120) AND [2] SAME as normal  Answer Assessment - Initial Assessment Questions 1. RESPIRATORY STATUS: "Describe your breathing?" (e.g., wheezing, shortness of breath, unable to speak, severe coughing)      SOB,WHEEZING 2. ONSET: "When did this breathing problem begin?"      Today 3. PATTERN "Does the difficult breathing come and go, or has it been constant since it started?"      At night 4. SEVERITY: "How bad is your breathing?" (e.g., mild, moderate, severe)    - MILD: No SOB at rest, mild SOB with walking, speaks normally in sentences, can lie down, no retractions, pulse < 100.    - MODERATE: SOB at rest, SOB with minimal exertion and prefers to sit, cannot lie down flat, speaks in phrases, mild retractions, audible wheezing, pulse 100-120.    - SEVERE: Very SOB at rest, speaks in single words, struggling to breathe, sitting hunched forward, retractions, pulse > 120      Mild 5. RECURRENT SYMPTOM: "Have you had difficulty breathing before?" If Yes, ask: "When was the last time?" and "What happened that time?"      Yes 6. CARDIAC HISTORY: "Do you have any history of heart disease?" (e.g., heart attack, angina, bypass surgery, angioplasty)      Yes 7. LUNG HISTORY: "Do you have any history of lung disease?"  (e.g., pulmonary embolus, asthma, emphysema)      COPD 8. CAUSE: "What do you think is causing the breathing problem?"      COPD 9. OTHER SYMPTOMS: "Do you have any other symptoms? (e.g., dizziness, runny nose, cough, chest pain, fever)     No 10. O2 SATURATION MONITOR:  "Do you use an oxygen saturation monitor (pulse oximeter) at home?" If Yes, "What is your reading (oxygen level) today?" "What is your usual oxygen saturation reading?" (e.g., 95%)       No 11. PREGNANCY: "Is there any chance you are pregnant?" "When was your last menstrual period?"       No 12. TRAVEL: "Have you traveled out of the country in the last month?" (e.g., travel history, exposures)       No  Protocols used: Breathing Difficulty-A-AH

## 2021-08-31 NOTE — Telephone Encounter (Signed)
Attempted to contact patient, number not in service. Will try to call again later.

## 2021-09-03 ENCOUNTER — Encounter: Payer: Self-pay | Admitting: Physician Assistant

## 2021-09-03 ENCOUNTER — Ambulatory Visit (INDEPENDENT_AMBULATORY_CARE_PROVIDER_SITE_OTHER): Payer: Medicare Other | Admitting: Physician Assistant

## 2021-09-03 VITALS — BP 139/90 | HR 72 | Temp 98.3°F | Wt 154.4 lb

## 2021-09-03 DIAGNOSIS — J449 Chronic obstructive pulmonary disease, unspecified: Secondary | ICD-10-CM

## 2021-09-03 DIAGNOSIS — R109 Unspecified abdominal pain: Secondary | ICD-10-CM | POA: Diagnosis not present

## 2021-09-03 DIAGNOSIS — N390 Urinary tract infection, site not specified: Secondary | ICD-10-CM

## 2021-09-03 DIAGNOSIS — R300 Dysuria: Secondary | ICD-10-CM

## 2021-09-03 LAB — URINALYSIS, ROUTINE W REFLEX MICROSCOPIC
Bilirubin, UA: NEGATIVE
Ketones, UA: NEGATIVE
Leukocytes,UA: NEGATIVE
Nitrite, UA: NEGATIVE
Protein,UA: NEGATIVE
RBC, UA: NEGATIVE
Specific Gravity, UA: >1.03 — ABNORMAL HIGH (ref 1.005–1.030)
Urobilinogen, Ur: 0.2 mg/dL (ref 0.2–1.0)
pH, UA: 5.5 (ref 5.0–7.5)

## 2021-09-03 MED ORDER — TRELEGY ELLIPTA 100-62.5-25 MCG/ACT IN AEPB: 1.0000 | INHALATION_SPRAY | Freq: Every day | RESPIRATORY_TRACT | 2 refills | Status: DC

## 2021-09-03 NOTE — Progress Notes (Signed)
Established Patient Office Visit  Name: Andrea Kane   MRN: [id number]    DOB: [birth year]   Date:09/03/2021  Today's Provider: Jacquelin Hawking, MHS, PA-C Introduced myself to the patient as a PA-C and provided education on APPs in clinical practice.         Subjective  Chief Complaint  Chief Complaint  Patient presents with   Medication Refill    Would like to be on Trelegy again. When pharmacy closed this was not transferred to new pharmacy.    Medication Refill Associated symptoms include coughing (in the AM). Pertinent negatives include no chills, diaphoresis or fever.    States she was having some trouble breathing over the weekend States she was informed that her pharmacy was closing and they did not send her a Trelegy inhaler when this happened States she wakes up and coughs for a bit before it calms down Reports SOB is worse with warm, humid weather   She reports she is having right sided flank pain  Intermittent, seems to be worse when she drinks sodas Thinks she felt some burning with urination yesterday.    Patient Active Problem List   Diagnosis Date Noted   Carpal tunnel syndrome 03/02/2020   COPD (chronic obstructive pulmonary disease) (HCC) 07/04/2017   Chronic low back pain 07/04/2017   Major depression 07/04/2017   Opiate abuse, continuous (HCC) 07/04/2017    Past Surgical History:  Procedure Laterality Date   BACK SURGERY     X3   OOPHORECTOMY Left     Family History  Problem Relation Age of Onset   Diabetes Mother    Hypertension Father    COPD Sister    Drug abuse Sister    Cerebral palsy Son    Alzheimer's disease Maternal Grandmother    Brain cancer Maternal Grandmother    Hypertension Paternal Grandfather    Hyperlipidemia Paternal Grandfather    Cancer Other 16       cervical   Breast cancer Maternal Aunt 55   Stomach cancer Maternal Aunt     Social History   Tobacco Use   Smoking status: Former    Types: Cigars    Smokeless tobacco: Never   Tobacco comments:    over 6-7 years ago   Substance Use Topics   Alcohol use: No     Current Outpatient Medications:    albuterol (VENTOLIN HFA) 108 (90 Base) MCG/ACT inhaler, Inhale 2 puffs into the lungs every 6 (six) hours as needed for wheezing or shortness of breath., Disp: 1 each, Rfl: 0   Buprenorphine HCl-Naloxone HCl 8-2 MG FILM, Place 3 Film under the tongue daily., Disp: , Rfl: 0   diclofenac Sodium (VOLTAREN) 1 % GEL, SMARTSIG:2-4 Gram(s) Topical Twice Daily, Disp: , Rfl:    Fluticasone-Umeclidin-Vilant (TRELEGY ELLIPTA) 100-62.5-25 MCG/ACT AEPB, Inhale 1 puff into the lungs daily., Disp: 1 each, Rfl: 2   traMADol (ULTRAM) 50 MG tablet, Take 50-100 mg by mouth 4 (four) times daily as needed., Disp: , Rfl:    traZODone (DESYREL) 50 MG tablet, Take 100 mg by mouth at bedtime., Disp: , Rfl: 5   ALPRAZolam (XANAX) 0.25 MG tablet, Take 0.25 mg by mouth 4 (four) times daily. (Patient not taking: Reported on 07/21/2020), Disp: , Rfl:   No Known Allergies  I personally reviewed active problem list, medication list, allergies with the patient/caregiver today.   Review of Systems  Constitutional:  Negative for chills, diaphoresis and fever.  Respiratory:  Positive for cough (in the AM), shortness of breath and wheezing.   Genitourinary:  Positive for dysuria and flank pain.     Objective  Vitals:   09/03/21 1050 09/03/21 1055  BP: (!) 147/89 139/90  Pulse: 71 72  Temp: 98.3 F (36.8 C)   TempSrc: Oral   SpO2: 98%   Weight: 154 lb 6.4 oz (70 kg)     Body mass index is 28.24 kg/m.  Physical Exam Constitutional:      General: She is awake.     Appearance: Normal appearance. She is well-developed and normal weight.  HENT:     Head: Normocephalic and atraumatic.  Cardiovascular:     Rate and Rhythm: Normal rate and regular rhythm.     Pulses: Normal pulses.          Radial pulses are 2+ on the right side and 2+ on the left side.     Heart  sounds: Normal heart sounds.  Pulmonary:     Effort: Prolonged expiration present.     Breath sounds: Decreased air movement present. Examination of the left-upper field reveals wheezing. Examination of the right-middle field reveals wheezing. Examination of the left-middle field reveals wheezing. Examination of the right-lower field reveals decreased breath sounds. Examination of the left-lower field reveals decreased breath sounds. Decreased breath sounds and wheezing present. No rhonchi or rales.  Abdominal:     General: Abdomen is flat.     Palpations: Abdomen is soft.     Tenderness: There is no abdominal tenderness. There is no right CVA tenderness or left CVA tenderness.  Musculoskeletal:     Right lower leg: No edema.     Left lower leg: No edema.  Neurological:     Mental Status: She is alert.  Psychiatric:        Attention and Perception: Attention normal.        Mood and Affect: Mood and affect normal.        Speech: Speech normal.        Behavior: Behavior normal. Behavior is cooperative.     Recent Results (from the past 2160 hour(s))  Urinalysis, Routine w reflex microscopic     Status: Abnormal   Collection Time: 09/03/21 11:35 AM  Result Value Ref Range   Specific Gravity, UA >1.030 (H) 1.005 - 1.030   pH, UA 5.5 5.0 - 7.5   Color, UA Yellow Yellow   Appearance Ur Cloudy (A) Clear   Leukocytes,UA Negative Negative   Protein,UA Negative Negative/Trace   Glucose, UA Trace (A) Negative   Ketones, UA Negative Negative   RBC, UA Negative Negative   Bilirubin, UA Negative Negative   Urobilinogen, Ur 0.2 0.2 - 1.0 mg/dL   Nitrite, UA Negative Negative     PHQ2/9:    09/03/2021   10:58 AM 07/21/2020    1:07 PM 07/12/2019    1:06 PM 12/09/2018   10:47 AM 09/14/2018    1:49 PM  Depression screen PHQ 2/9  Decreased Interest 2 0 0 3 2  Down, Depressed, Hopeless 1 0 0 0 0  PHQ - 2 Score 3 0 0 3 2  Altered sleeping 2   0 1  Tired, decreased energy 1   3 3   Change in  appetite 1   3 2   Feeling bad or failure about yourself  2   2 2   Trouble concentrating 3   3 3   Moving slowly or fidgety/restless 2   0 1  Suicidal thoughts 0   0 0  PHQ-9 Score 14   14 14   Difficult doing work/chores Somewhat difficult   Very difficult Somewhat difficult      Fall Risk:    09/03/2021   10:57 AM 07/21/2020    1:06 PM 07/12/2019    1:04 PM 08/14/2018   12:48 PM 07/04/2017   10:17 AM  Fall Risk   Falls in the past year?  0 0 1 Yes  Number falls in past yr: 1  0 0 1  Injury with Fall? 1  0 1 No  Risk for fall due to : History of fall(s) Medication side effect     Follow up Falls evaluation completed Falls evaluation completed;Education provided;Falls prevention discussed         Functional Status Survey:      Assessment & Plan  Problem List Items Addressed This Visit       Respiratory   COPD (chronic obstructive pulmonary disease) (HCC) - Primary    Chronic, historic condition Appears to be untreated per patient  Reports recent increase in SOB and coughing - has not had Trelegy for some time Will refill Trelegy today and pt informed of Albuterol inhaler that was called in last week Recommend she follow up in 3 months to assess progress and monitor response to therapy        Relevant Medications   Fluticasone-Umeclidin-Vilant (TRELEGY ELLIPTA) 100-62.5-25 MCG/ACT AEPB   Other Visit Diagnoses     Dysuria     Acute, new problem Reports mild dysuria and flank pain over the past few days UA was mildly abnormal- cloudy appearance, trace glucose Recommend sending for culture to rule out UTI given symptoms Results to dictate further management  Follow up as needed    Relevant Orders   Urinalysis, Routine w reflex microscopic (Completed)   Flank pain     Acute, new problem  Reports new dysuria over the past day  UA was cloudy with trace glucose  Will order urine culture to rule out UTI  Results to dictate further management  Follow up as needed    Relevant Orders   Urinalysis, Routine w reflex microscopic (Completed)   Urine Culture        Return in about 3 months (around 12/04/2021) for COPD and labs.   I, Elon Eoff E Miel Wisener, PA-C, have reviewed all documentation for this visit. The documentation on 09/03/21 for the exam, diagnosis, procedures, and orders are all accurate and complete.   11/03/21, MHS, PA-C [facility]

## 2021-09-03 NOTE — Assessment & Plan Note (Signed)
Chronic, historic condition Appears to be untreated per patient  Reports recent increase in SOB and coughing - has not had Trelegy for some time Will refill Trelegy today and pt informed of Albuterol inhaler that was called in last week Recommend she follow up in 3 months to assess progress and monitor response to therapy

## 2021-09-03 NOTE — Patient Instructions (Addendum)
I have sent in a refill of your Trelegy for you to use once per day I have sent in an Albuterol inhaler for you to use as needed when you have trouble breathing and coughing  I recommend increasing your daily water intake to at least 75 oz of water per day This will help you stay hydrated and can improve your urinary symptoms

## 2021-09-03 NOTE — Telephone Encounter (Signed)
Attempted to contact patient, number not in service.

## 2021-09-06 LAB — URINE CULTURE

## 2021-09-06 MED ORDER — NITROFURANTOIN MONOHYD MACRO 100 MG PO CAPS: 100.0000 mg | ORAL_CAPSULE | Freq: Two times a day (BID) | ORAL | 0 refills | Status: AC

## 2021-09-06 NOTE — Addendum Note (Signed)
Addended by: Jacquelin Hawking on: 09/06/2021 10:47 AM   Modules accepted: Orders

## 2021-09-23 ENCOUNTER — Other Ambulatory Visit: Payer: Self-pay | Admitting: Physician Assistant

## 2021-09-24 ENCOUNTER — Ambulatory Visit (INDEPENDENT_AMBULATORY_CARE_PROVIDER_SITE_OTHER): Payer: Medicare Other | Admitting: Family Medicine

## 2021-09-24 ENCOUNTER — Encounter: Payer: Self-pay | Admitting: Family Medicine

## 2021-09-24 VITALS — BP 125/85 | HR 76 | Temp 98.0°F | Wt 154.6 lb

## 2021-09-24 DIAGNOSIS — H60331 Swimmer's ear, right ear: Secondary | ICD-10-CM

## 2021-09-24 MED ORDER — CIPROFLOXACIN-DEXAMETHASONE 0.3-0.1 % OT SUSP: 4.0000 [drp] | Freq: Two times a day (BID) | OTIC | 0 refills | Status: DC

## 2021-11-26 ENCOUNTER — Telehealth: Payer: Self-pay | Admitting: Family Medicine

## 2021-11-26 NOTE — Telephone Encounter (Signed)
Copied from CRM [id number]. Topic: Medicare AWV >> Nov 26, 2021  3:11 PM [name] wrote: Reason for CRM: N/A unable to leave a message for patient to call back and schedule the Medicare Annual Wellness Visit (AWV) virtually or by telephone.  Last AWV 07/21/20  Please schedule at anytime with [facility]-Nurse Health Advisor.   Any questions, please call me at [phone]

## 2021-12-13 ENCOUNTER — Other Ambulatory Visit: Payer: Self-pay | Admitting: Family Medicine

## 2021-12-14 NOTE — Telephone Encounter (Signed)
Requested Prescriptions  Pending Prescriptions Disp Refills  . albuterol (VENTOLIN HFA) 108 (90 Base) MCG/ACT inhaler [Pharmacy Med Name: ALBUTEROL HFA INH (200 PUFFS) 8.5GM] 8.5 g 2    Sig: INHALE 2 PUFFS INTO THE LUNGS EVERY 6 HOURS AS NEEDED FOR WHEEZING OR SHORTNESS OF BREATH     Pulmonology:  Beta Agonists 2 Passed - 12/13/2021  3:40 AM      Passed - Last BP in normal range    BP Readings from Last 1 Encounters:  09/24/21 125/85         Passed - Last Heart Rate in normal range    Pulse Readings from Last 1 Encounters:  09/24/21 76         Passed - Valid encounter within last 12 months    Recent Outpatient Visits          2 months ago Acute swimmer's ear of right side   [facility], Megan P, DO   3 months ago Chronic obstructive pulmonary disease, unspecified COPD type (HCC)   [facility] Mecum, Erin E, PA-C   1 year ago Acute otitis media, unspecified otitis media type   [facility] Larae Grooms, NP   1 year ago Bilateral carpal tunnel syndrome   [facility] Caro Laroche, DO   3 years ago Moderate episode of recurrent major depressive disorder [facility])   [facility] [place], Megan P, DO

## 2021-12-18 ENCOUNTER — Other Ambulatory Visit: Payer: Self-pay | Admitting: Physician Assistant

## 2021-12-18 DIAGNOSIS — J449 Chronic obstructive pulmonary disease, unspecified: Secondary | ICD-10-CM

## 2021-12-18 NOTE — Telephone Encounter (Signed)
Requested medications are due for refill today.  yes  Requested medications are on the active medications list.  yes  Last refill. 09/03/2021 1 with 2 refills  Future visit scheduled.   no  Notes to clinic.  Refill is not delegated.    Requested Prescriptions  Pending Prescriptions Disp [place] 100-62.5-25 MCG/ACT AEPB [Pharmacy Med Name: TRELEGY ELLIPTA 100-62.5MCG INH 30P] 60 each     Sig: Inhale 1 puff into the lungs daily.     Off-Protocol Failed - 12/18/2021  3:39 AM      Failed - Medication not assigned to a protocol, review manually.      Passed - Valid encounter within last 12 months    Recent Outpatient Visits           2 months ago Acute swimmer's ear of right side   [place], Megan P, DO   3 months ago Chronic obstructive pulmonary disease, unspecified COPD type ([place])   [place], Erin E, PA-C   1 year ago Acute otitis media, unspecified otitis media type   [facility] Jon Billings, NP   1 year ago Bilateral carpal tunnel syndrome   [place], DO   3 years ago Moderate episode of recurrent major depressive disorder [facility])   [place], Megan P, DO

## 2021-12-24 NOTE — Telephone Encounter (Signed)
appt

## 2021-12-24 NOTE — Telephone Encounter (Signed)
Called patient to set up an appointment. I was unable to leave a vm

## 2021-12-27 ENCOUNTER — Ambulatory Visit
Admission: EM | Admit: 2021-12-27 | Discharge: 2021-12-27 | Disposition: A | Discharge status: Home / Self Care | Payer: Medicare Other | Attending: Family Medicine | Admitting: Family Medicine

## 2021-12-27 DIAGNOSIS — L03211 Cellulitis of face: Secondary | ICD-10-CM

## 2021-12-27 MED ORDER — DOXYCYCLINE HYCLATE 100 MG PO CAPS
100.0000 mg | ORAL_CAPSULE | Freq: Two times a day (BID) | ORAL | 0 refills | Status: AC
Start: 2021-12-27 — End: 2022-01-06

## 2021-12-27 MED ORDER — KETOROLAC TROMETHAMINE 60 MG/2ML IM SOLN
30.0000 mg | Freq: Once | INTRAMUSCULAR | Status: AC
  Administered 2021-12-27: 30 mg via INTRAMUSCULAR

## 2021-12-27 NOTE — Discharge Instructions (Addendum)

## 2021-12-27 NOTE — ED Triage Notes (Signed)
Pt present blister on the top of her left side lip, symptoms started four days  ago. Pt was shaving hair from the area and now blister has developed.

## 2021-12-29 NOTE — ED Provider Notes (Signed)
[facility]    CSN: [id number] Arrival date & time: 12/27/21  1941      History   Chief Complaint Chief Complaint  Patient presents with   Blister    HPI Andrea Kane is a 51 y.o. female.   HPI  Andrea Kane presents for lesion on her top lip.  Patient reports about 4 days ago she shaved a black hair from her lip.  The next day she noticed a small pimple.  She tried to pop it and the next morning it has swollen.  Since then has decreased swelling and now it is more red and it is painful.  Denies fever, chills, nausea, vomiting, jaw pain, neck pain, headache.  She took over-the-counter medicine for pain but it did not help.  She has a throbbing sensation.  She has had fever blisters before but nothing like this.  No rash anywhere else.     Past Medical History:  Diagnosis Date   Anxiety    COPD (chronic obstructive pulmonary disease) ([place])    Ectopic pregnancy     Patient Active Problem List   Diagnosis Date Noted   Carpal tunnel syndrome 03/02/2020   COPD (chronic obstructive pulmonary disease) ([place]) 07/04/2017   Chronic low back pain 07/04/2017   Major depression 07/04/2017   Opiate abuse, continuous ([place]) 07/04/2017    Past Surgical History:  Procedure Laterality Date   BACK SURGERY     X3   OOPHORECTOMY Left     OB History     Gravida  4   Para  1   Term      Preterm  1   AB  3   Living  1      SAB      IAB  2   Ectopic  1   Multiple      Live Births  1            Home Medications    Prior to Admission medications   Medication Sig Start Date End Date Taking? Authorizing Provider  doxycycline (VIBRAMYCIN) 100 MG capsule Take 1 capsule (100 mg total) by mouth 2 (two) times daily for 10 days. 12/27/21 01/06/22 Yes Hobie Kohles, DO  albuterol (VENTOLIN HFA) 108 (90 Base) MCG/ACT inhaler INHALE 2 PUFFS INTO THE LUNGS EVERY 6 HOURS AS NEEDED FOR WHEEZING OR SHORTNESS OF BREATH 12/14/21   [place], Megan P, DO  ALPRAZolam (XANAX)  0.25 MG tablet Take 0.25 mg by mouth 4 (four) times daily. Patient not taking: Reported on 07/21/2020 02/21/20   [provider]  Buprenorphine HCl-Naloxone HCl 8-2 MG FILM Place 3 Film under the tongue daily. 08/21/17   [provider]  ciprofloxacin-dexamethasone (CIPRODEX) OTIC suspension Place 4 drops into the right ear 2 (two) times daily. 09/24/21   Park Liter P, DO  diclofenac Sodium (VOLTAREN) 1 % GEL SMARTSIG:2-4 Gram(s) Topical Twice Daily 04/30/21   [provider]  Fluticasone-Umeclidin-Vilant (TRELEGY ELLIPTA) 100-62.5-25 MCG/ACT AEPB Inhale 1 puff into the lungs daily. 09/03/21   Mecum, Erin E, PA-C  traMADol (ULTRAM) 50 MG tablet Take 50-100 mg by mouth 4 (four) times daily as needed. 02/24/19   [provider]  traZODone (DESYREL) 50 MG tablet Take 100 mg by mouth at bedtime. 06/05/17   [provider]    Family History Family History  Problem Relation Age of Onset   Diabetes Mother    Hypertension Father    COPD Sister    Drug abuse Sister  Cerebral palsy Son    Alzheimer's disease Maternal Grandmother    Brain cancer Maternal Grandmother    Hypertension Paternal Grandfather    Hyperlipidemia Paternal Grandfather    Cancer Other 48       cervical   Breast cancer Maternal Aunt 55   Stomach cancer Maternal Aunt     Social History Social History   Tobacco Use   Smoking status: Former    Types: Cigars   Smokeless tobacco: Never   Tobacco comments:    over 6-7 years ago   Vaping Use   Vaping Use: Former   Substances: CBD, Mixture of cannabinoids  Substance Use Topics   Alcohol use: No   Drug use: Not Currently     Allergies   Patient has no known allergies.   Review of Systems Review of Systems :negative unless otherwise stated in HPI.      Physical Exam Triage Vital Signs ED Triage Vitals  Enc Vitals Group     BP 12/27/21 1953 (!) 165/100     Pulse Rate 12/27/21 1953 70     Resp 12/27/21 1953 18      Temp 12/27/21 1953 98.7 F (37.1 C)     Temp Source 12/27/21 1953 Oral     SpO2 12/27/21 1953 100 %     Weight --      Height --      Head Circumference --      Peak Flow --      Pain Score 12/27/21 1952 10     Pain Loc --      Pain Edu? --      Excl. in [place]? --    No data found.  Updated Vital Signs BP (!) 165/100 (BP Location: Left Arm)   Pulse 70   Temp 98.7 F (37.1 C) (Oral)   Resp 18   SpO2 100%   Visual Acuity Right Eye Distance:   Left Eye Distance:   Bilateral Distance:    Right Eye Near:   Left Eye Near:    Bilateral Near:     Physical Exam  GEN: alert, uncomfortable appearing female HENT: Induration as described below, poor dentition with multiple caries and cracked office EYES: extra occular movements intact, no scleral injection  CV: regular rate RESP: no increased work of breathing MSK: no extremity edema, no gross deformities NEURO: alert, moves all extremities appropriately, normal gait PSYCH: Normal affect, appropriate speech and behavior  SKIN: warm and dry; left side of top lip with large induration and pustules and surrounding erythema.  No ulcers.  Induration extends into the oral cavity.   UC Treatments / Results  Labs (all labs ordered are listed, but only abnormal results are displayed) Labs Reviewed - No data to display  EKG   Radiology No results found.  Procedures Procedures (including critical care time)  Medications Ordered in UC Medications  ketorolac (TORADOL) injection 30 mg (30 mg Intramuscular Given 12/27/21 2037)    Initial Impression / Assessment and Plan / UC Course  I have reviewed the triage vital signs and the nursing notes.  Pertinent labs & imaging results that were available during my care of the patient were reviewed by me and considered in my medical decision making (see chart for details).     Patient is a 51 y.o. femalewho presents for worsening lip lesion that started 4 days ago.  She is afebrile but  hypertensive.  I believe her hypertension is related to pain.  Given Toradol  injection with some relief.  Patient requesting I&D.  I do not think this is herpes labialis.  It is reasonable to see if I can provide her some immediate relief.  I&D with purulent drainage.  Antibiotics sent to the pharmacy for folliculitis vs cellulitis.  Reviewed expectations regarding course of current medical issues.  All questions asked were answered.  Outlined signs and symptoms indicating need for more acute intervention. Patient verbalized understanding. After Visit Summary given.   Final Clinical Impressions(s) / UC Diagnoses   Final diagnoses:  Cellulitis of face     Discharge Instructions      Stop by the pharmacy to pick up your prescriptions.  Follow up with your primary care provider as needed   Go to ED for red flag symptoms, including; fevers you cannot reduce with Tylenol/Motrin, severe headaches, vision changes, numbness/weakness in part of the body, lethargy, confusion, intractable vomiting, severe dehydration, chest pain, breathing difficulty, severe persistent abdominal or pelvic pain, signs of severe infection (increased redness, swelling of an area), feeling faint or passing out, dizziness, etc. You should especially go to the ED for sudden acute worsening of condition if you do not elect to go at this time.     ED Prescriptions     Medication Sig Dispense Auth. Provider   doxycycline (VIBRAMYCIN) 100 MG capsule Take 1 capsule (100 mg total) by mouth 2 (two) times daily for 10 days. 20 capsule Lyndee Hensen, DO      PDMP not reviewed this encounter.              Lyndee Hensen, DO 12/29/21 1738

## 2022-03-14 ENCOUNTER — Encounter: Payer: Self-pay | Admitting: Family Medicine

## 2022-03-14 ENCOUNTER — Ambulatory Visit (INDEPENDENT_AMBULATORY_CARE_PROVIDER_SITE_OTHER): Payer: Medicare Other | Admitting: Family Medicine

## 2022-03-14 ENCOUNTER — Other Ambulatory Visit (HOSPITAL_COMMUNITY)
Admission: RE | Admit: 2022-03-14 | Discharge: 2022-03-14 | Disposition: A | Discharge status: Home / Self Care | Payer: Medicare Other | Source: Ambulatory Visit | Attending: Family Medicine | Admitting: Family Medicine

## 2022-03-14 VITALS — BP 147/92 | HR 89 | Temp 98.4°F | Wt 157.1 lb

## 2022-03-14 DIAGNOSIS — Z1211 Encounter for screening for malignant neoplasm of colon: Secondary | ICD-10-CM

## 2022-03-14 DIAGNOSIS — Z01419 Encounter for gynecological examination (general) (routine) without abnormal findings: Secondary | ICD-10-CM | POA: Insufficient documentation

## 2022-03-14 DIAGNOSIS — I10 Essential (primary) hypertension: Secondary | ICD-10-CM | POA: Diagnosis not present

## 2022-03-14 DIAGNOSIS — Z1231 Encounter for screening mammogram for malignant neoplasm of breast: Secondary | ICD-10-CM | POA: Diagnosis not present

## 2022-03-14 DIAGNOSIS — R030 Elevated blood-pressure reading, without diagnosis of hypertension: Secondary | ICD-10-CM

## 2022-03-14 DIAGNOSIS — Z1151 Encounter for screening for human papillomavirus (HPV): Secondary | ICD-10-CM | POA: Diagnosis not present

## 2022-03-14 DIAGNOSIS — F331 Major depressive disorder, recurrent, moderate: Secondary | ICD-10-CM

## 2022-03-14 DIAGNOSIS — Z124 Encounter for screening for malignant neoplasm of cervix: Secondary | ICD-10-CM

## 2022-03-14 DIAGNOSIS — Z136 Encounter for screening for cardiovascular disorders: Secondary | ICD-10-CM

## 2022-03-14 DIAGNOSIS — J449 Chronic obstructive pulmonary disease, unspecified: Secondary | ICD-10-CM | POA: Diagnosis not present

## 2022-03-14 DIAGNOSIS — Z1159 Encounter for screening for other viral diseases: Secondary | ICD-10-CM | POA: Diagnosis not present

## 2022-03-14 DIAGNOSIS — Z0000 Encounter for general adult medical examination without abnormal findings: Secondary | ICD-10-CM | POA: Diagnosis not present

## 2022-03-14 DIAGNOSIS — Z7189 Other specified counseling: Secondary | ICD-10-CM | POA: Diagnosis not present

## 2022-03-14 DIAGNOSIS — F1110 Opioid abuse, uncomplicated: Secondary | ICD-10-CM

## 2022-03-14 LAB — URINALYSIS, ROUTINE W REFLEX MICROSCOPIC
Bilirubin, UA: NEGATIVE
Glucose, UA: NEGATIVE
Ketones, UA: NEGATIVE
Leukocytes,UA: NEGATIVE
Nitrite, UA: NEGATIVE
Protein,UA: NEGATIVE
RBC, UA: NEGATIVE
Specific Gravity, UA: 1.02 (ref 1.005–1.030)
Urobilinogen, Ur: 0.2 mg/dL (ref 0.2–1.0)
pH, UA: 5 (ref 5.0–7.5)

## 2022-03-14 LAB — MICROALBUMIN, URINE WAIVED
Creatinine, Urine Waived: 50 mg/dL (ref 10–300)
Microalb, Ur Waived: 80 mg/L — ABNORMAL HIGH (ref 0–19)

## 2022-03-14 MED ORDER — ALBUTEROL SULFATE HFA 108 (90 BASE) MCG/ACT IN AERS: 2.0000 | INHALATION_SPRAY | Freq: Four times a day (QID) | RESPIRATORY_TRACT | 2 refills | Status: DC | PRN

## 2022-03-14 MED ORDER — LISINOPRIL 10 MG PO TABS: 10.0000 mg | ORAL_TABLET | Freq: Every day | ORAL | 3 refills | Status: DC

## 2022-03-14 MED ORDER — TRELEGY ELLIPTA 100-62.5-25 MCG/ACT IN AEPB: 1.0000 | INHALATION_SPRAY | Freq: Every day | RESPIRATORY_TRACT | 5 refills | Status: DC

## 2022-03-14 NOTE — Assessment & Plan Note (Signed)
Will start lisinopril and recheck 1 month. Call with any concerns.

## 2022-03-14 NOTE — Assessment & Plan Note (Signed)
A voluntary discussion about advance care planning including the explanation and discussion of advance directives was extensively discussed  with the patient for 2 minutes with patient present.  Explanation about the health care proxy and Living will was reviewed and packet with forms with explanation of how to fill them out was given.  During this discussion, the patient was not able to identify a health care proxy and plans to fill out the paperwork required.  Patient was offered a separate Advance Care Planning visit for further assistance with forms.    

## 2022-03-14 NOTE — Progress Notes (Signed)
BP (!) 147/92 (BP Location: Left Arm, Cuff Size: Normal)   Pulse 89   Temp 98.4 F (36.9 C) (Oral)   Wt 157 lb 1.6 oz (71.3 kg)   SpO2 99%   BMI 28.73 kg/m    Subjective:    Patient ID: Andrea Kane, female    DOB: 14-Mar-1971, 51 y.o.   MRN: [id number]  HPI: Andrea Kane is a 51 y.o. female presenting on 03/14/2022 for comprehensive medical examination. Current medical complaints include:  ELEVATED BLOOD PRESSURE Duration of elevated BP:  about 3 months BP monitoring frequency: weekly BP range: 170s/100s Previous BP meds: no Recent stressors: no Family history of hypertension: yes Recurrent headaches: yes Visual changes: yes Palpitations: yes  Dyspnea: yes Chest pain: yes Lower extremity edema: no Dizzy/lightheaded: no Transient ischemic attacks: yes  COPD COPD status: stable Satisfied with current treatment?: yes Oxygen use: no Dyspnea frequency: daily Cough frequency: occasionally Rescue inhaler frequency:  occasionally Limitation of activity: yes Productive cough: No Pneumovax: Up to Date Influenza: Not up to Date   Menopausal Symptoms: no  Functional Status Survey: Is the patient deaf or have difficulty hearing?: Yes Does the patient have difficulty seeing, even when wearing glasses/contacts?: Yes Does the patient have difficulty concentrating, remembering, or making decisions?: Yes Does the patient have difficulty walking or climbing stairs?: No Does the patient have difficulty dressing or bathing?: No Does the patient have difficulty doing errands alone such as visiting a doctor's office or shopping?: No     03/14/2022    1:09 PM 09/03/2021   10:57 AM 07/21/2020    1:06 PM 07/12/2019    1:04 PM 08/14/2018   12:48 PM  Fall Risk   Falls in the past year? 1  0 0 1  Number falls in past yr: 1 1  0 0  Injury with Fall? 0 1  0 1  Risk for fall due to : History of fall(s) History of fall(s) Medication side effect    Follow up Falls evaluation completed  Falls evaluation completed Falls evaluation completed;Education provided;Falls prevention discussed      Depression Screen    03/14/2022    1:09 PM 09/03/2021   10:58 AM 07/21/2020    1:07 PM 07/12/2019    1:06 PM 12/09/2018   10:47 AM  Depression screen PHQ 2/9  Decreased Interest 1 2 0 0 3  Down, Depressed, Hopeless 1 1 0 0 0  PHQ - 2 Score 2 3 0 0 3  Altered sleeping 2 2   0  Tired, decreased energy 2 1   3   Change in appetite 3 1   3   Feeling bad or failure about yourself  3 2   2   Trouble concentrating 3 3   3   Moving slowly or fidgety/restless 1 2   0  Suicidal thoughts 0 0   0  PHQ-9 Score 16 14   14   Difficult doing work/chores Very difficult Somewhat difficult   Very difficult    Advanced Directives Does patient have a HCPOA?    no Does patient have a living will or MOST form?  no  Past Medical History:  Past Medical History:  Diagnosis Date   Anxiety    COPD (chronic obstructive pulmonary disease) (HCC)    Ectopic pregnancy     Surgical History:  Past Surgical History:  Procedure Laterality Date   BACK SURGERY     X3   OOPHORECTOMY Left     Medications:  No  current outpatient medications on file prior to visit.   No current facility-administered medications on file prior to visit.    Allergies:  No Known Allergies  Social History:  Social History   Socioeconomic History   Marital status: Single    Spouse name: Not on file   Number of children: Not on file   Years of education: Not on file   Highest education level: Not on file  Occupational History   Occupation: disability   Tobacco Use   Smoking status: Former    Types: Cigars   Smokeless tobacco: Never   Tobacco comments:    over 6-7 years ago   Vaping Use   Vaping Use: Former   Substances: CBD, Mixture of cannabinoids  Substance and Sexual Activity   Alcohol use: No   Drug use: Not Currently   Sexual activity: Not Currently    Birth control/protection: None  Other Topics Concern    Not on file  Social History Narrative   Not on file   Social Determinants of Health   Financial Resource Strain: Low Risk  (07/21/2020)   Overall Financial Resource Strain (CARDIA)    Difficulty of Paying Living Expenses: Not hard at all  Food Insecurity: No Food Insecurity (07/21/2020)   Hunger Vital Sign    Worried About Running Out of Food in the Last Year: Never true    Ran Out of Food in the Last Year: Never true  Transportation Needs: No Transportation Needs (07/21/2020)   PRAPARE - Administrator, Civil Service (Medical): No    Lack of Transportation (Non-Medical): No  Physical Activity: Inactive (07/21/2020)   Exercise Vital Sign    Days of Exercise per Week: 0 days    Minutes of Exercise per Session: 0 min  Stress: No Stress Concern Present (07/21/2020)   [employer] of Occupational Health - Occupational Stress Questionnaire    Feeling of Stress : Only a little  Social Connections: Socially Isolated (07/12/2019)   Social Connection and Isolation Panel [NHANES]    Frequency of Communication with Friends and Family: More than three times a week    Frequency of Social Gatherings with Friends and Family: More than three times a week    Attends Religious Services: Never    Database administrator or Organizations: No    Attends Engineer, structural: Never    Marital Status: Never married  Catering manager Violence: Not on file   Social History   Tobacco Use  Smoking Status Former   Types: Cigars  Smokeless Tobacco Never  Tobacco Comments   over 6-7 years ago    Social History   Substance and Sexual Activity  Alcohol Use No    Family History:  Family History  Problem Relation Age of Onset   Diabetes Mother    Hypertension Father    COPD Sister    Drug abuse Sister    Cerebral palsy Son    Alzheimer's disease Maternal Grandmother    Brain cancer Maternal Grandmother    Hypertension Paternal Grandfather    Hyperlipidemia Paternal  Grandfather    Cancer Other 16       cervical   Breast cancer Maternal Aunt 55   Stomach cancer Maternal Aunt     Past medical history, surgical history, medications, allergies, family history and social history reviewed with patient today and changes made to appropriate areas of the chart.   Review of Systems  Constitutional: Negative.   HENT: Negative.  Eyes:  Positive for blurred vision. Negative for double vision, photophobia, pain, discharge and redness.  Respiratory: Negative.    Cardiovascular:  Positive for chest pain and palpitations. Negative for orthopnea, claudication, leg swelling and PND.  Gastrointestinal:  Positive for heartburn. Negative for abdominal pain, blood in stool, constipation, diarrhea, melena, nausea and vomiting.  Genitourinary: Negative.   Musculoskeletal: Negative.   Skin: Negative.   Neurological:  Positive for dizziness and headaches. Negative for tingling, tremors, sensory change, speech change, focal weakness, seizures, loss of consciousness and weakness.  Endo/Heme/Allergies:  Positive for polydipsia. Negative for environmental allergies. Bruises/bleeds easily.  Psychiatric/Behavioral:  Negative for depression, hallucinations, memory loss, substance abuse and suicidal ideas. The patient is nervous/anxious. The patient does not have insomnia.     All other ROS negative except what is listed above and in the HPI.      Objective:    BP (!) 147/92 (BP Location: Left Arm, Cuff Size: Normal)   Pulse 89   Temp 98.4 F (36.9 C) (Oral)   Wt 157 lb 1.6 oz (71.3 kg)   SpO2 99%   BMI 28.73 kg/m   Wt Readings from Last 3 Encounters:  03/14/22 157 lb 1.6 oz (71.3 kg)  09/24/21 154 lb 9.6 oz (70.1 kg)  09/03/21 154 lb 6.4 oz (70 kg)    Physical Exam Vitals and nursing note reviewed. Exam conducted with a chaperone present.  Constitutional:      General: She is not in acute distress.    Appearance: Normal appearance. She is not ill-appearing,  toxic-appearing or diaphoretic.  HENT:     Head: Normocephalic and atraumatic.     Right Ear: Tympanic membrane, ear canal and external ear normal. There is no impacted cerumen.     Left Ear: Tympanic membrane, ear canal and external ear normal. There is no impacted cerumen.     Nose: Nose normal. No congestion or rhinorrhea.     Mouth/Throat:     Mouth: Mucous membranes are moist.     Pharynx: Oropharynx is clear. No oropharyngeal exudate or posterior oropharyngeal erythema.  Eyes:     General: No scleral icterus.       Right eye: No discharge.        Left eye: No discharge.     Extraocular Movements: Extraocular movements intact.     Conjunctiva/sclera: Conjunctivae normal.     Pupils: Pupils are equal, round, and reactive to light.  Neck:     Vascular: No carotid bruit.  Cardiovascular:     Rate and Rhythm: Normal rate and regular rhythm.     Pulses: Normal pulses.     Heart sounds: No murmur heard.    No friction rub. No gallop.  Pulmonary:     Effort: Pulmonary effort is normal. No respiratory distress.     Breath sounds: Normal breath sounds. No stridor. No wheezing, rhonchi or rales.  Chest:     Chest wall: No tenderness.  Breasts:    Right: Normal.     Left: Normal.  Abdominal:     General: Abdomen is flat. Bowel sounds are normal. There is no distension.     Palpations: Abdomen is soft. There is no mass.     Tenderness: There is no abdominal tenderness. There is no right CVA tenderness, left CVA tenderness, guarding or rebound.     Hernia: No hernia is present.  Genitourinary:    Labia:        Right: No rash, tenderness, lesion or injury.  Left: No rash, tenderness, lesion or injury.      Urethra: No prolapse, urethral pain, urethral swelling or urethral lesion.     Vagina: Normal.     Cervix: Normal.     Uterus: Normal.      Adnexa: Right adnexa normal and left adnexa normal.  Musculoskeletal:        General: No swelling, tenderness, deformity or signs of  injury.     Cervical back: Normal range of motion and neck supple. No rigidity. No muscular tenderness.     Right lower leg: No edema.     Left lower leg: No edema.  Lymphadenopathy:     Cervical: No cervical adenopathy.  Skin:    General: Skin is warm and dry.     Capillary Refill: Capillary refill takes less than 2 seconds.     Coloration: Skin is not jaundiced or pale.     Findings: No bruising, erythema, lesion or rash.  Neurological:     General: No focal deficit present.     Mental Status: She is alert and oriented to person, place, and time. Mental status is at baseline.     Cranial Nerves: No cranial nerve deficit.     Sensory: No sensory deficit.     Motor: No weakness.     Coordination: Coordination normal.     Gait: Gait normal.     Deep Tendon Reflexes: Reflexes normal.  Psychiatric:        Mood and Affect: Mood normal.        Behavior: Behavior normal.        Thought Content: Thought content normal.        Judgment: Judgment normal.        03/14/2022    1:25 PM 07/21/2020    1:08 PM  6CIT Screen  What Year? 0 points 0 points  What month? 0 points 0 points  What time? 0 points 0 points  Count back from 20 0 points 0 points  Months in reverse 0 points 0 points  Repeat phrase 2 points 2 points  Total Score 2 points 2 points    Results for orders placed or performed in visit on 09/03/21  Urine Culture   Specimen: Urine   UR  Result Value Ref Range   Urine Culture, Routine Final report (A)    Organism ID, Bacteria Enterococcus faecalis (A)    Antimicrobial Susceptibility Comment   Urinalysis, Routine w reflex microscopic  Result Value Ref Range   Specific Gravity, UA >1.030 (H) 1.005 - 1.030   pH, UA 5.5 5.0 - 7.5   Color, UA Yellow Yellow   Appearance Ur Cloudy (A) Clear   Leukocytes,UA Negative Negative   Protein,UA Negative Negative/Trace   Glucose, UA Trace (A) Negative   Ketones, UA Negative Negative   RBC, UA Negative Negative   Bilirubin, UA  Negative Negative   Urobilinogen, Ur 0.2 0.2 - 1.0 mg/dL   Nitrite, UA Negative Negative      Assessment & Plan:   Problem List Items Addressed This Visit       Cardiovascular and Mediastinum   Primary hypertension    Will start lisinopril and recheck 1 month. Call with any concerns.       Relevant Medications   lisinopril (ZESTRIL) 10 MG tablet     Respiratory   COPD (chronic obstructive pulmonary disease) (HCC)    Under good control on current regimen. Continue current regimen. Continue to monitor. Call with any concerns. Refills  given.        Relevant Medications   albuterol (VENTOLIN HFA) 108 (90 Base) MCG/ACT inhaler   Fluticasone-Umeclidin-Vilant (TRELEGY ELLIPTA) 100-62.5-25 MCG/ACT AEPB     Other   Major depression    Not interested in medication at this time. Call with any concerns.       Relevant Orders   CBC with Differential/Platelet   Opiate abuse, continuous (HCC)    Has been off suboxone. Doing well. Continue to monitor.       Advance directive discussed with patient    A voluntary discussion about advance care planning including the explanation and discussion of advance directives was extensively discussed  with the patient for 2 minutes with patient present.  Explanation about the health care proxy and Living will was reviewed and packet with forms with explanation of how to fill them out was given.  During this discussion, the patient was not able to identify a health care proxy and plans to fill out the paperwork required.  Patient was offered a separate Advance Care Planning visit for further assistance with forms.         Other Visit Diagnoses     Encounter for Medicare annual wellness exam    -  Primary   Preventative care discussed today as below.   Routine general medical examination at a health care facility       Vaccines up to date. Screening labs checked today. Pap done. Mammo and colonoscopy ordreed. Continue diet and exercise. Call with  any concerns.   Elevated blood pressure reading       Has also been running high at home. Will start lisinopril. Await results.   Relevant Orders   CBC with Differential/Platelet   Comprehensive metabolic panel   Urinalysis, Routine w reflex microscopic   TSH   Microalbumin, Urine Waived   Screening for cervical cancer       Pap done today.   Relevant Orders   Cytology - PAP   Screening for cardiovascular condition       Labs drawn today. Await results. Treat as needed.   Relevant Orders   Lipid Panel w/o Chol/HDL Ratio   Need for hepatitis C screening test       Labs drawn today. Await results. Treat as needed.   Relevant Orders   Hepatitis C Antibody   Encounter for screening mammogram for malignant neoplasm of breast       Mammo ordered today.   Relevant Orders   MM 3D SCREEN BREAST BILATERAL   Screening for colon cancer       Colonoscopy ordered today.   Relevant Orders   Ambulatory referral to Gastroenterology        Preventative Services:  Health Risk Assessment and Personalized Prevention Plan: Done Today Bone Mass Measurements: N/A Breast Cancer Screening: Ordered Today CVD Screening: Done today Cervical Cancer Screening: Done Today Colon Cancer Screening: Ordered today Depression Screening: Done today Diabetes Screening:  Done today Glaucoma Screening: See your eye doctor Hepatitis B vaccine: N/A Hepatitis C screening: Done today HIV Screening: Up to date Flu Vaccine: Declined Lung cancer Screening: N/A Obesity Screening: Done today Pneumonia Vaccines (2): up to date STI Screening: N/A  Follow up plan: Return in about 4 weeks (around 04/11/2022).   LABORATORY TESTING:  - Pap smear: pap done  IMMUNIZATIONS:   - Tdap: Tetanus vaccination status reviewed: last tetanus booster within 10 years. - Influenza: Refused - Pneumovax: Up to date - Prevnar: Not applicable - Zostavax  vaccine: Not applicable  SCREENING: -Mammogram: Ordered today  -  Colonoscopy: Ordered today   PATIENT COUNSELING:   Advised to take 1 mg of folate supplement per day if capable of pregnancy.   Sexuality: Discussed sexually transmitted diseases, partner selection, use of condoms, avoidance of unintended pregnancy  and contraceptive alternatives.   Advised to avoid cigarette smoking.  I discussed with the patient that most people either abstain from alcohol or drink within safe limits (<=14/week and <=4 drinks/occasion for males, <=7/weeks and <= 3 drinks/occasion for females) and that the risk for alcohol disorders and other health effects rises proportionally with the number of drinks per week and how often a drinker exceeds daily limits.  Discussed cessation/primary prevention of drug use and availability of treatment for abuse.   Diet: Encouraged to adjust caloric intake to maintain  or achieve ideal body weight, to reduce intake of dietary saturated fat and total fat, to limit sodium intake by avoiding high sodium foods and not adding table salt, and to maintain adequate dietary potassium and calcium preferably from fresh fruits, vegetables, and low-fat dairy products.    stressed the importance of regular exercise  Injury prevention: Discussed safety belts, safety helmets, smoke detector, smoking near bedding or upholstery.   Dental health: Discussed importance of regular tooth brushing, flossing, and dental visits.    NEXT PREVENTATIVE PHYSICAL DUE IN 1 YEAR. Return in about 4 weeks (around 04/11/2022).

## 2022-03-14 NOTE — Assessment & Plan Note (Signed)
Under good control on current regimen. Continue current regimen. Continue to monitor. Call with any concerns. Refills given.   

## 2022-03-14 NOTE — Patient Instructions (Addendum)
Preventative Services:  Health Risk Assessment and Personalized Prevention Plan: Done Today Bone Mass Measurements: N/A Breast Cancer Screening: Ordered Today CVD Screening: Done today Cervical Cancer Screening: Done Today Colon Cancer Screening: Ordered today Depression Screening: Done today Diabetes Screening:  Done today Glaucoma Screening: See your eye doctor Hepatitis B vaccine: N/A Hepatitis C screening: Done today HIV Screening: Up to date Flu Vaccine: Declined Lung cancer Screening: N/A Obesity Screening: Done today Pneumonia Vaccines (2): up to date STI Screening: N/A  Please call to schedule your mammogram and/or bone density: [facility]  Address: [address], [place], [place] [address] Phone: [phone]  [facility] [address] [place],  [place]  [address] Phone: [phone]

## 2022-03-14 NOTE — Assessment & Plan Note (Signed)
Not interested in medication at this time. Call with any concerns.

## 2022-03-14 NOTE — Assessment & Plan Note (Signed)
Has been off suboxone. Doing well. Continue to monitor.

## 2022-03-15 LAB — COMPREHENSIVE METABOLIC PANEL
ALT: 17 IU/L (ref 0–32)
AST: 19 IU/L (ref 0–40)
Albumin/Globulin Ratio: 2.2 (ref 1.2–2.2)
Albumin: 4.8 g/dL (ref 3.9–4.9)
Alkaline Phosphatase: 84 IU/L (ref 44–121)
BUN/Creatinine Ratio: 12 (ref 9–23)
BUN: 9 mg/dL (ref 6–24)
Bilirubin Total: <0.2 mg/dL (ref 0.0–1.2)
CO2: 23 mmol/L (ref 20–29)
Calcium: 9.7 mg/dL (ref 8.7–10.2)
Chloride: 98 mmol/L (ref 96–106)
Creatinine, Ser: 0.74 mg/dL (ref 0.57–1.00)
Globulin, Total: 2.2 g/dL (ref 1.5–4.5)
Glucose: 80 mg/dL (ref 70–99)
Potassium: 3.8 mmol/L (ref 3.5–5.2)
Sodium: 137 mmol/L (ref 134–144)
Total Protein: 7 g/dL (ref 6.0–8.5)
eGFR: 99 mL/min/{1.73_m2} (ref >59)

## 2022-03-15 LAB — CBC WITH DIFFERENTIAL/PLATELET
Basophils Absolute: 0.1 10*3/uL (ref 0.0–0.2)
Basos: 2 %
EOS (ABSOLUTE): 0.2 10*3/uL (ref 0.0–0.4)
Eos: 2 %
Hematocrit: 44.2 % (ref 34.0–46.6)
Hemoglobin: 14.8 g/dL (ref 11.1–15.9)
Immature Grans (Abs): 0 10*3/uL (ref 0.0–0.1)
Immature Granulocytes: 0 %
Lymphocytes Absolute: 2.7 10*3/uL (ref 0.7–3.1)
Lymphs: 34 %
MCH: 29.1 pg (ref 26.6–33.0)
MCHC: 33.5 g/dL (ref 31.5–35.7)
MCV: 87 fL (ref 79–97)
Monocytes Absolute: 0.5 10*3/uL (ref 0.1–0.9)
Monocytes: 7 %
Neutrophils Absolute: 4.5 10*3/uL (ref 1.4–7.0)
Neutrophils: 55 %
Platelets: 398 10*3/uL (ref 150–450)
RBC: 5.09 x10E6/uL (ref 3.77–5.28)
RDW: 13 % (ref 11.7–15.4)
WBC: 8.1 10*3/uL (ref 3.4–10.8)

## 2022-03-15 LAB — LIPID PANEL W/O CHOL/HDL RATIO
Cholesterol, Total: 190 mg/dL (ref 100–199)
HDL: 79 mg/dL (ref >39)
LDL Chol Calc (NIH): 94 mg/dL (ref 0–99)
Triglycerides: 93 mg/dL (ref 0–149)
VLDL Cholesterol Cal: 17 mg/dL (ref 5–40)

## 2022-03-15 LAB — HEPATITIS C ANTIBODY: Hep C Virus Ab: NONREACTIVE

## 2022-03-15 LAB — TSH: TSH: 1.55 u[IU]/mL (ref 0.450–4.500)

## 2022-03-18 ENCOUNTER — Telehealth: Payer: Self-pay

## 2022-03-18 ENCOUNTER — Other Ambulatory Visit: Payer: Self-pay

## 2022-03-18 DIAGNOSIS — Z1211 Encounter for screening for malignant neoplasm of colon: Secondary | ICD-10-CM

## 2022-03-18 MED ORDER — NA SULFATE-K SULFATE-MG SULF 17.5-3.13-1.6 GM/177ML PO SOLN: 1.0000 | Freq: Once | ORAL | 0 refills | Status: AC

## 2022-03-18 NOTE — Telephone Encounter (Signed)
Gastroenterology Pre-Procedure Review  Request Date: 04/04/22 Requesting Physician: Dr. Allegra Lai  PATIENT REVIEW QUESTIONS: The patient responded to the following health history questions as indicated:    1. Are you having any GI issues? yes (heartburn) 2. Do you have a personal history of Polyps? no 3. Do you have a family history of Colon Cancer or Polyps? no 4. Diabetes Mellitus? no 5. Joint replacements in the past 12 months?no 6. Major health problems in the past 3 months?no 7. Any artificial heart valves, MVP, or defibrillator?no    MEDICATIONS & ALLERGIES:    Patient reports the following regarding taking any anticoagulation/antiplatelet therapy:   Plavix, Coumadin, Eliquis, Xarelto, Lovenox, Pradaxa, Brilinta, or Effient? no Aspirin? no  Patient confirms/reports the following medications:  Current Outpatient Medications  Medication Sig Dispense Refill   albuterol (VENTOLIN HFA) 108 (90 Base) MCG/ACT inhaler Inhale 2 puffs into the lungs every 6 (six) hours as needed for wheezing or shortness of breath. 8.5 g 2   Fluticasone-Umeclidin-Vilant (TRELEGY ELLIPTA) 100-62.5-25 MCG/ACT AEPB Inhale 1 puff into the lungs daily. 1 each 5   lisinopril (ZESTRIL) 10 MG tablet Take 1 tablet (10 mg total) by mouth daily. 30 tablet 3   No current facility-administered medications for this visit.    Patient confirms/reports the following allergies:  No Known Allergies  No orders of the defined types were placed in this encounter.   AUTHORIZATION INFORMATION Primary Insurance: 1D#: Group #:  Secondary Insurance: 1D#: Group #:  SCHEDULE INFORMATION: Date: 04/04/22 Time: Location: [facility]

## 2022-03-21 LAB — CYTOLOGY - PAP
Comment: NEGATIVE
Comment: NEGATIVE
Comment: NEGATIVE
Diagnosis: UNDETERMINED — AB
HPV 16: NEGATIVE
HPV 18 / 45: NEGATIVE
High risk HPV: POSITIVE — AB

## 2022-03-22 ENCOUNTER — Other Ambulatory Visit: Payer: Self-pay | Admitting: Family Medicine

## 2022-03-22 DIAGNOSIS — R87610 Atypical squamous cells of undetermined significance on cytologic smear of cervix (ASC-US): Secondary | ICD-10-CM

## 2022-04-03 ENCOUNTER — Encounter: Payer: Self-pay | Admitting: Gastroenterology

## 2022-04-04 ENCOUNTER — Encounter: Admission: RE | Payer: Self-pay | Source: Home / Self Care

## 2022-04-04 ENCOUNTER — Ambulatory Visit: Admission: RE | Admit: 2022-04-04 | Payer: Medicare Other | Source: Home / Self Care | Admitting: Gastroenterology

## 2022-04-04 SURGERY — COLONOSCOPY WITH PROPOFOL
Anesthesia: General

## 2022-04-05 ENCOUNTER — Telehealth: Payer: Self-pay

## 2022-04-05 NOTE — Telephone Encounter (Signed)
Contacted patient in regards to missed colonoscopy 04/04/22 with Dr. Vicente Males.  LVM for pt to return my call if she would like to reschedule.  Thanks, [place], [place]

## 2022-04-11 ENCOUNTER — Ambulatory Visit: Payer: 59 | Admitting: Family Medicine

## 2022-04-19 ENCOUNTER — Ambulatory Visit: Payer: Medicare Other | Admitting: Obstetrics and Gynecology

## 2022-04-30 ENCOUNTER — Ambulatory Visit: Payer: 59 | Admitting: Physician Assistant

## 2022-04-30 ENCOUNTER — Ambulatory Visit: Payer: Medicare Other | Admitting: Obstetrics & Gynecology

## 2022-04-30 ENCOUNTER — Telehealth: Payer: Self-pay | Admitting: Obstetrics & Gynecology

## 2022-04-30 NOTE — Telephone Encounter (Signed)
Reached out to pt to reschedule procedure that was scheduled on 04/30/2022 with Dr. Hulan Fray at 1:35.  Called pt, but pt was not at home.

## 2022-05-01 ENCOUNTER — Encounter: Payer: Self-pay | Admitting: Obstetrics & Gynecology

## 2022-05-01 NOTE — Telephone Encounter (Signed)
Reached out to pt (2x) to reschedule procedure (colpo) that was scheduled on 04/30/2022 with Dr. Hulan Fray at 1:35.  Called pt, but did not reach.  (Phone rang and rang.  Could not leave a message)  Will send a [facility] letter.

## 2022-05-03 ENCOUNTER — Telehealth (INDEPENDENT_AMBULATORY_CARE_PROVIDER_SITE_OTHER): Payer: 59 | Admitting: Family Medicine

## 2022-05-03 ENCOUNTER — Encounter: Payer: Self-pay | Admitting: Family Medicine

## 2022-05-03 ENCOUNTER — Encounter: Payer: Self-pay | Admitting: Obstetrics & Gynecology

## 2022-05-03 DIAGNOSIS — R109 Unspecified abdominal pain: Secondary | ICD-10-CM

## 2022-05-03 DIAGNOSIS — F331 Major depressive disorder, recurrent, moderate: Secondary | ICD-10-CM

## 2022-05-03 MED ORDER — ARIPIPRAZOLE 2 MG PO TABS: 2.0000 mg | ORAL_TABLET | Freq: Every day | ORAL | 3 refills | Status: DC | PRN

## 2022-05-03 MED ORDER — CITALOPRAM HYDROBROMIDE 10 MG PO TABS: 10.0000 mg | ORAL_TABLET | Freq: Every day | ORAL | 3 refills | Status: DC

## 2022-05-03 NOTE — Assessment & Plan Note (Signed)
Not doing well. Will restart her abilify and her celexa and recheck 1 month. Call with any concerns.

## 2022-05-03 NOTE — Progress Notes (Signed)
Called pt to schedule appt. Left message for pt to call the office and schedule appt.

## 2022-05-03 NOTE — Progress Notes (Signed)
There were no vitals taken for this visit.   Subjective:    Patient ID: Andrea Kane, female    DOB: [birth year], 52 y.o.   MRN: [id number]  HPI: Andrea Kane is a 52 y.o. female  Chief Complaint  Patient presents with   Back Pain    Patient says she thinks she has a kidney infection. Patient says she became symptomatic and says it started hurting really bad in her lower back. Patient says she has been taking over the counter Azo. Pain radiates from back to front lower abdominal.    Anxiety   ANXIETY/STRESS- her SO came home from prison and she has not been doing well. Duration: chronic Status:exacerbated Anxious mood: yes  Excessive worrying: yes Irritability: yes  Sweating: yes Nausea: yes Palpitations:yes Hyperventilation: no Panic attacks: no Agoraphobia: no  Obscessions/compulsions: no Depressed mood: yes    05/03/2022    8:39 AM 03/14/2022    1:09 PM 09/03/2021   10:58 AM 07/21/2020    1:07 PM 07/12/2019    1:06 PM  Depression screen PHQ 2/9  Decreased Interest 3 1 2  0 0  Down, Depressed, Hopeless 3 1 1  0 0  PHQ - 2 Score 6 2 3  0 0  Altered sleeping 3 2 2     Tired, decreased energy 2 2 1     Change in appetite 2 3 1     Feeling bad or failure about yourself  3 3 2     Trouble concentrating 3 3 3     Moving slowly or fidgety/restless 2 1 2     Suicidal thoughts 2 0 0    PHQ-9 Score 23 16 14     Difficult doing work/chores Very difficult Very difficult Somewhat difficult        05/03/2022    8:41 AM 03/14/2022    1:10 PM 09/03/2021   10:58 AM 08/14/2018   12:51 PM  GAD 7 : Generalized Anxiety Score  Nervous, Anxious, on Edge 3 3 1 2   Control/stop worrying 3 3 1 2   Worry too much - different things 3 3 2 3   Trouble relaxing 3 2 2  0  Restless 2 1 2 1   Easily annoyed or irritable 3 3 2 2   Afraid - awful might happen 3 3 1  0  Total GAD 7 Score 20 18 11 10   Anxiety Difficulty Very difficult Very difficult Somewhat difficult Somewhat difficult   Anhedonia:  no Weight changes: no Insomnia: no   Hypersomnia: no Fatigue/loss of energy: yes Feelings of worthlessness: yes Feelings of guilt: no Impaired concentration/indecisiveness: no Suicidal ideations: no  Crying spells: no Recent Stressors/Life Changes: yes   Relationship problems: yes   Family stress: no     Financial stress: no    Job stress: no    Recent death/loss: no  URINARY SYMPTOMS Duration: yesterday Dysuria: burning Urinary frequency: yes Urgency: yes Small volume voids: yes Symptom severity: severe Urinary incontinence: no Foul odor: no Hematuria: no Abdominal pain: no Back pain: yes Suprapubic pain/pressure: yes Flank pain: yes Fever:  no Vomiting: yes Relief with cranberry juice: no Relief with pyridium: no Status: worse Previous urinary tract infection: yes Recurrent urinary tract infection: no Sexual activity: monogomous History of sexually transmitted disease: no Vaginal discharge: no Treatments attempted: pyridium, cranberry, and increasing fluids    Relevant past medical, surgical, family and social history reviewed and updated as indicated. Interim medical history since our last visit reviewed. Allergies and medications reviewed and updated.  Review of Systems  Constitutional: Negative.   Respiratory: Negative.    Cardiovascular: Negative.   Gastrointestinal: Negative.   Genitourinary:  Positive for decreased urine volume, dysuria, flank pain, frequency and urgency. Negative for difficulty urinating, dyspareunia, enuresis, genital sores, hematuria, menstrual problem, pelvic pain, vaginal bleeding, vaginal discharge and vaginal pain.  Musculoskeletal:  Positive for back pain and myalgias. Negative for arthralgias, gait problem, joint swelling, neck pain and neck stiffness.  Skin: Negative.   Psychiatric/Behavioral:  Positive for agitation and dysphoric mood. Negative for behavioral problems, confusion, decreased concentration, hallucinations,  self-injury, sleep disturbance and suicidal ideas. The patient is nervous/anxious. The patient is not hyperactive.     Per HPI unless specifically indicated above     Objective:    There were no vitals taken for this visit.  Wt Readings from Last 3 Encounters:  03/14/22 157 lb 1.6 oz (71.3 kg)  09/24/21 154 lb 9.6 oz (70.1 kg)  09/03/21 154 lb 6.4 oz (70 kg)    Physical Exam Vitals and nursing note reviewed.  Constitutional:      General: She is not in acute distress.    Appearance: Normal appearance. She is not ill-appearing, toxic-appearing or diaphoretic.  HENT:     Head: Normocephalic and atraumatic.     Right Ear: External ear normal.     Left Ear: External ear normal.     Nose: Nose normal.     Mouth/Throat:     Mouth: Mucous membranes are moist.     Pharynx: Oropharynx is clear.  Eyes:     General: No scleral icterus.       Right eye: No discharge.        Left eye: No discharge.     Conjunctiva/sclera: Conjunctivae normal.     Pupils: Pupils are equal, round, and reactive to light.  Pulmonary:     Effort: Pulmonary effort is normal. No respiratory distress.     Comments: Speaking in full sentences Musculoskeletal:        General: Normal range of motion.     Cervical back: Normal range of motion.  Skin:    Coloration: Skin is not jaundiced or pale.     Findings: No bruising, erythema, lesion or rash.  Neurological:     Mental Status: She is alert and oriented to person, place, and time. Mental status is at baseline.  Psychiatric:        Mood and Affect: Mood normal.        Behavior: Behavior normal.        Thought Content: Thought content normal.        Judgment: Judgment normal.     Results for orders placed or performed in visit on 03/14/22  CBC with Differential/Platelet  Result Value Ref Range   WBC 8.1 3.4 - 10.8 x10E3/uL   RBC 5.09 3.77 - 5.28 x10E6/uL   Hemoglobin 14.8 11.1 - 15.9 g/dL   Hematocrit 44.2 34.0 - 46.6 %   MCV 87 79 - 97 fL   MCH  29.1 26.6 - 33.0 pg   MCHC 33.5 31.5 - 35.7 g/dL   RDW 13.0 11.7 - 15.4 %   Platelets 398 150 - 450 x10E3/uL   Neutrophils 55 Not Estab. %   Lymphs 34 Not Estab. %   Monocytes 7 Not Estab. %   Eos 2 Not Estab. %   Basos 2 Not Estab. %   Neutrophils Absolute 4.5 1.4 - 7.0 x10E3/uL   Lymphocytes Absolute 2.7 0.7 - 3.1 x10E3/uL   Monocytes  Absolute 0.5 0.1 - 0.9 x10E3/uL   EOS (ABSOLUTE) 0.2 0.0 - 0.4 x10E3/uL   Basophils Absolute 0.1 0.0 - 0.2 x10E3/uL   Immature Granulocytes 0 Not Estab. %   Immature Grans (Abs) 0.0 0.0 - 0.1 x10E3/uL  Comprehensive metabolic panel  Result Value Ref Range   Glucose 80 70 - 99 mg/dL   BUN 9 6 - 24 mg/dL   Creatinine, Ser 0.74 0.57 - 1.00 mg/dL   eGFR 99 >59 mL/min/1.73   BUN/Creatinine Ratio 12 9 - 23   Sodium 137 134 - 144 mmol/L   Potassium 3.8 3.5 - 5.2 mmol/L   Chloride 98 96 - 106 mmol/L   CO2 23 20 - 29 mmol/L   Calcium 9.7 8.7 - 10.2 mg/dL   Total Protein 7.0 6.0 - 8.5 g/dL   Albumin 4.8 3.9 - 4.9 g/dL   Globulin, Total 2.2 1.5 - 4.5 g/dL   Albumin/Globulin Ratio 2.2 1.2 - 2.2   Bilirubin Total <0.2 0.0 - 1.2 mg/dL   Alkaline Phosphatase 84 44 - 121 IU/L   AST 19 0 - 40 IU/L   ALT 17 0 - 32 IU/L  Lipid Panel w/o Chol/HDL Ratio  Result Value Ref Range   Cholesterol, Total 190 100 - 199 mg/dL   Triglycerides 93 0 - 149 mg/dL   HDL 79 >39 mg/dL   VLDL Cholesterol Cal 17 5 - 40 mg/dL   LDL Chol Calc (NIH) 94 0 - 99 mg/dL  Urinalysis, Routine w reflex microscopic  Result Value Ref Range   Specific Gravity, UA 1.020 1.005 - 1.030   pH, UA 5.0 5.0 - 7.5   Color, UA Yellow Yellow   Appearance Ur Cloudy (A) Clear   Leukocytes,UA Negative Negative   Protein,UA Negative Negative/Trace   Glucose, UA Negative Negative   Ketones, UA Negative Negative   RBC, UA Negative Negative   Bilirubin, UA Negative Negative   Urobilinogen, Ur 0.2 0.2 - 1.0 mg/dL   Nitrite, UA Negative Negative   Microscopic Examination Comment   TSH  Result Value  Ref Range   TSH 1.550 0.450 - 4.500 uIU/mL  Microalbumin, Urine Waived  Result Value Ref Range   Microalb, Ur Waived 80 (H) 0 - 19 mg/L   Creatinine, Urine Waived 50 10 - 300 mg/dL   Microalb/Creat Ratio 30-300 (H) <30 mg/g  Hepatitis C Antibody  Result Value Ref Range   Hep C Virus Ab Non Reactive Non Reactive  Cytology - PAP  Result Value Ref Range   High risk HPV Positive (A)    HPV 16 Negative    HPV 18 / 45 Negative    Adequacy      Satisfactory for evaluation; transformation zone component PRESENT.   Diagnosis (A)     - Atypical squamous cells of undetermined significance (ASC-US)   Comment Normal Reference Range HPV - Negative    Comment Normal Reference Range HPV 16- Negative    Comment Normal Reference Range HPV 16 18 45 -Negative       Assessment & Plan:   Problem List Items Addressed This Visit       Other   Major depression    Not doing well. Will restart her abilify and her celexa and recheck 1 month. Call with any concerns.       Relevant Medications   citalopram (CELEXA) 10 MG tablet   Other Visit Diagnoses     Flank pain    -  Primary   Concern for  pyleo vs kidney stone. Will go over for UA and KUB today. Await results. Treat as needed.   Relevant Orders   Urinalysis, Routine w reflex microscopic   Urine Culture   DG Abd 1 View        Follow up plan: Return in about 4 weeks (around 05/31/2022).    This visit was completed via video visit through MyChart due to the restrictions of the COVID-19 pandemic. All issues as above were discussed and addressed. Physical exam was done as above through visual confirmation on video through MyChart. If it was felt that the patient should be evaluated in the office, they were directed there. The patient verbally consented to this visit. Location of the patient: home Location of the provider: home Those involved with this call:  Provider: Park Liter, DO CMA: Irena Reichmann, [place] Desk/Registration:  [employer]  Time spent on call:  25 minutes with patient face to face via video conference. More than 50% of this time was spent in counseling and coordination of care. 40 minutes total spent in review of patient's record and preparation of their chart.

## 2022-05-28 ENCOUNTER — Other Ambulatory Visit: Payer: Self-pay | Admitting: Family Medicine

## 2022-05-28 NOTE — Telephone Encounter (Signed)
Last RF 03/14/22 8.5 grams 2 refills- pt has not picked up refill Called pharmacy and pt has 2 Refills left   Requested Prescriptions  Refused Prescriptions Disp Refills   albuterol (VENTOLIN HFA) 108 (90 Base) MCG/ACT inhaler [Pharmacy Med Name: ALBUTEROL HFA INH (200 PUFFS) 8.5GM] 8.5 g 2    Sig: INHALE 2 PUFFS INTO THE LUNGS EVERY 6 HOURS AS NEEDED FOR WHEEZING OR SHORTNESS OF BREATH     Pulmonology:  Beta Agonists 2 Failed - 05/28/2022  3:39 AM      Failed - Last BP in normal range    BP Readings from Last 1 Encounters:  03/14/22 (!) 147/92         Passed - Last Heart Rate in normal range    Pulse Readings from Last 1 Encounters:  03/14/22 89         Passed - Valid encounter within last 12 months    Recent Outpatient Visits           3 weeks ago Flank pain   [place], Megan P, DO   2 months ago Encounter for [employer] annual wellness exam   [place] P, DO   8 months ago Acute swimmer's ear of right side   [facility] [place], Megan P, DO   8 months ago Chronic obstructive pulmonary disease, unspecified COPD type ([place])   [place], Erin E, PA-C   1 year ago Acute otitis media, unspecified otitis media type   [place] Jon Bill[place], NP

## 2022-06-05 ENCOUNTER — Ambulatory Visit: Payer: 59 | Admitting: Obstetrics & Gynecology

## 2022-06-05 ENCOUNTER — Telehealth: Payer: Self-pay | Admitting: Obstetrics & Gynecology

## 2022-06-05 NOTE — Telephone Encounter (Signed)
Reached out to pt to reschedule procedure that was scheduled with Dr. Hulan Fray on 06/05/2022 at 1:15.  Left message for pt to call back.

## 2022-06-06 NOTE — Telephone Encounter (Signed)
Reached out to pt (2x) to reschedule procedure that was scheduled with Dr. Hulan Fray on 06/05/2022 at 1:15.  Was able to reschedule the appt for 07/15/2022 at 2:35.  Pt was put on the waitlist in case a sooner appt comes open.

## 2022-06-23 ENCOUNTER — Encounter: Payer: Self-pay | Admitting: Emergency Medicine

## 2022-06-23 DIAGNOSIS — J449 Chronic obstructive pulmonary disease, unspecified: Secondary | ICD-10-CM | POA: Insufficient documentation

## 2022-06-23 DIAGNOSIS — I10 Essential (primary) hypertension: Secondary | ICD-10-CM | POA: Insufficient documentation

## 2022-06-23 DIAGNOSIS — T23201A Burn of second degree of right hand, unspecified site, initial encounter: Secondary | ICD-10-CM | POA: Insufficient documentation

## 2022-06-23 DIAGNOSIS — T310 Burns involving less than 10% of body surface: Secondary | ICD-10-CM | POA: Diagnosis not present

## 2022-06-23 DIAGNOSIS — Y929 Unspecified place or not applicable: Secondary | ICD-10-CM | POA: Insufficient documentation

## 2022-06-23 DIAGNOSIS — T23171A Burn of first degree of right wrist, initial encounter: Secondary | ICD-10-CM | POA: Insufficient documentation

## 2022-06-23 DIAGNOSIS — X088XXA Exposure to other specified smoke, fire and flames, initial encounter: Secondary | ICD-10-CM | POA: Insufficient documentation
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
## 2022-06-24 ENCOUNTER — Emergency Department
Admission: EM | Admit: 2022-06-24 | Discharge: 2022-06-24 | Disposition: A | Discharge status: Home / Self Care | Payer: 59 | Attending: Emergency Medicine | Admitting: Emergency Medicine

## 2022-06-24 DIAGNOSIS — T23201A Burn of second degree of right hand, unspecified site, initial encounter: Secondary | ICD-10-CM

## 2022-06-24 MED ORDER — BACITRACIN 500 UNIT/GM EX OINT
TOPICAL_OINTMENT | Freq: Two times a day (BID) | CUTANEOUS | Status: DC
  Administered 2022-06-24: 1 via TOPICAL
  Filled 2022-06-24: qty 28.4
  Filled 2022-06-24: qty 0.9

## 2022-06-24 MED ORDER — OXYCODONE-ACETAMINOPHEN 5-325 MG PO TABS
1.0000 | ORAL_TABLET | Freq: Once | ORAL | Status: AC
  Administered 2022-06-24: 1 via ORAL
  Filled 2022-06-24: qty 1
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
## 2022-06-26 ENCOUNTER — Telehealth: Payer: Self-pay | Admitting: *Deleted

## 2022-06-26 NOTE — Telephone Encounter (Signed)
        Patient  visited [facility] on 06/28/2022  for treatment    Telephone encounter attempt :  1st  A HIPAA compliant voice message was left requesting a return call.  Instructed patient to call back at [phone].  [place] [phone] [address] [place] , [place] [address] Email : [email or web address]

## 2022-06-27 ENCOUNTER — Telehealth: Payer: Self-pay | Admitting: *Deleted

## 2022-06-27 NOTE — Telephone Encounter (Signed)
    Patient  visited [facility] on 06/28/2022  for treatment      Telephone encounter attempt :  2nd   A HIPAA compliant voice message was left requesting a return call.  Instructed patient to call back at [phone].   [place] [phone] [address] , [place] [address] Email : [email or web address]

## 2022-07-10 DIAGNOSIS — H2513 Age-related nuclear cataract, bilateral: Secondary | ICD-10-CM | POA: Diagnosis not present

## 2022-07-15 ENCOUNTER — Ambulatory Visit: Payer: 59 | Admitting: Obstetrics & Gynecology

## 2022-07-17 ENCOUNTER — Encounter: Payer: Self-pay | Admitting: Obstetrics & Gynecology

## 2022-08-09 ENCOUNTER — Telehealth: Payer: Self-pay | Admitting: *Deleted

## 2022-08-09 ENCOUNTER — Telehealth: Payer: Self-pay | Admitting: Family Medicine

## 2022-08-09 ENCOUNTER — Telehealth: Payer: Self-pay

## 2022-08-09 NOTE — Telephone Encounter (Signed)
Pt returned call. Call was from [facility]. Gave pt her number.

## 2022-08-09 NOTE — Patient Outreach (Signed)
  Care Coordination   08/09/2022 Name: Andrea Kane MRN: [id number] DOB: 09/29/1970   Care Coordination Outreach Attempts:  An unsuccessful telephone outreach was attempted today to offer the patient information about available care coordination services.  Follow Up Plan:  Additional outreach attempts will be made to offer the patient care coordination information and services.   Encounter Outcome:  No Answer   Care Coordination Interventions:  No, not indicated    Kemper Durie, RN, MSN, [facility] Care Management Care Management Coordinator [phone]

## 2022-08-09 NOTE — Patient Outreach (Signed)
  Care Coordination   Initial Visit Note   08/09/2022 Name: Andrea Kane MRN: [id number] DOB: 10-24-1970  Andrea Kane is a 52 y.o. year old female who sees Dorcas Carrow, DO for primary care. I spoke with  [name] by phone today.  What matters to the patients health and wellness today?  Living with her mother and other family. State she is unable to focus on medical conditions until she is back in her own home and able to have dog as registered support animal.  Agrees to CSW outreach    Goals Addressed             This Visit's Progress    Effective management of HTN       Care Coordination Interventions: Evaluation of current treatment plan related to hypertension self management and patient's adherence to plan as established by provider Provided education to patient re: stroke prevention, s/s of heart attack and stroke Reviewed medications with patient and discussed importance of compliance Discussed plans with patient for ongoing care management follow up and provided patient with direct contact information for care management team Screening for signs and symptoms of depression related to chronic disease state          SDOH assessments and interventions completed:  Yes  SDOH Interventions Today    Flowsheet Row Most Recent Value  SDOH Interventions   Food Insecurity Interventions Intervention Not Indicated  Housing Interventions Other (Comment)  [scheduled for CSW]        Care Coordination Interventions:  Yes, provided   Interventions Today    Flowsheet Row Most Recent Value  Chronic Disease   Chronic disease during today's visit Other, Hypertension (HTN)  [anxiety/depression]  General Interventions   General Interventions Discussed/Reviewed General Interventions Reviewed, Doctor Visits  Doctor Visits Discussed/Reviewed Doctor Visits Reviewed  [Reminded she is due for PCP visit, encouraged to call to schedule]  Education Interventions   Education  Provided Provided Education  Provided Verbal Education On When to see the doctor, Other  [Encouraged to monitor blood pressure]  Mental Health Interventions   Mental Health Discussed/Reviewed Mental Health Reviewed, Depression, Anxiety, Refer to Social Work for counseling  Refer to Social Work for counseling regarding Depression, Anxiety/Coping  [CSW to follow up on 5/14]       Follow up plan: Follow up call scheduled for 6/3    Encounter Outcome:  Pt. Visit Completed   Kemper Durie, RN, MSN, [facility] Care Management Care Management Coordinator [phone]

## 2022-08-13 ENCOUNTER — Ambulatory Visit: Payer: Self-pay | Admitting: *Deleted

## 2022-08-14 NOTE — Patient Instructions (Signed)
Visit Information  Thank you for taking time to visit with me today. Please don't hesitate to contact me if I can be of assistance to you.   Following are the goals we discussed today:   Goals Addressed             This Visit's Progress    "I need my own place to live"       Care Coordination Interventions Patient will be mailed housing resources to review  Patient given information on possible apartment and will plan to visit/apply tomorrow between 8am-11am  Confirmed plan to continue to follow up with [facility]         Our next appointment is by telephone on 08/28/22 at 3pm  Please call the care guide team at [phone] if you need to cancel or reschedule your appointment.   If you are experiencing a Mental Health or Behavioral Health Crisis or need someone to talk to, please call the Suicide and Crisis Lifeline: 988   Patient verbalizes understanding of instructions and care plan provided today and agrees to view in MyChart. Active MyChart status and patient understanding of how to access instructions and care plan via MyChart confirmed with patient.     Telephone follow up appointment with care management team member scheduled for: 08/28/22    Verna Czech, LCSW Clinical Social Worker  [facility] Care Management [phone]

## 2022-08-14 NOTE — Patient Outreach (Addendum)
  Care Coordination   Initial Visit Note   08/14/2022 Late entry Name: BESTY CEBOLLERO MRN: [id number] DOB: 08-23-1970  ANABELL CHOLICO is a 52 y.o. year old female who sees Dorcas Carrow, DO for primary care. I spoke with  [name] by phone on 08/13/22.  What matters to the patients health and wellness today?  Affordable Housing    Goals Addressed             This Visit's Progress    "I need my own place to live"       Care Coordination Interventions Patient will be mailed housing resources to review  Patient given information on possible apartment and will plan to visit/apply tomorrow between 8am-11am  Confirmed plan to continue to follow up with [facility]         SDOH assessments and interventions completed:  Yes  SDOH Interventions Today    Flowsheet Row Most Recent Value  SDOH Interventions   Food Insecurity Interventions Intervention Not Indicated  Housing Interventions Other (Comment)  [CSW will provide housing resources]  Depression Interventions/Treatment  Medication  [suboxone clinic-virtually counselor that calls 1x per month [facility]]        Care Coordination Interventions:  Yes, provided  Interventions Today    Flowsheet Row Most Recent Value  Chronic Disease   Chronic disease during today's visit Hypertension (HTN), Other  [anxiety/depression]  General Interventions   General Interventions Discussed/Reviewed General Interventions Discussed, [employer]  [patient looking for alternative housing, currenlty lives with niece and her children]  Mental Health Interventions   Mental Health Discussed/Reviewed Mental Health Discussed, Mental Health Reviewed, Coping Strategies, Depression, Anxiety  [patient states that 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 [facility]]  Safety Interventions    Safety Discussed/Reviewed Safety Discussed  [patient encouraged to contact crisis line [phone] in then event of a mental health crisis]       Follow up plan: Follow up call scheduled for 08/28/22    Encounter Outcome:  Pt. Visit Completed

## 2022-08-15 ENCOUNTER — Ambulatory Visit: Payer: Self-pay | Admitting: *Deleted
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
## 2022-08-20 ENCOUNTER — Other Ambulatory Visit: Payer: Self-pay | Admitting: Family Medicine

## 2022-08-20 ENCOUNTER — Ambulatory Visit: Payer: 59 | Admitting: Family Medicine

## 2022-08-20 NOTE — Telephone Encounter (Signed)
Requested Prescriptions  Pending Prescriptions Disp Refills   albuterol (VENTOLIN HFA) 108 (90 Base) MCG/ACT inhaler [Pharmacy Med Name: ALBUTEROL HFA INH (200 PUFFS) 8.5GM] 8.5 g 2    Sig: INHALE 2 PUFFS INTO THE LUNGS EVERY 6 HOURS AS NEEDED FOR WHEEZING OR SHORTNESS OF BREATH     Pulmonology:  Beta Agonists 2 Failed - 08/20/2022  3:39 AM      Failed - Last BP in normal range    BP Readings from Last 1 Encounters:  06/23/22 (!) 151/91         Passed - Last Heart Rate in normal range    Pulse Readings from Last 1 Encounters:  06/23/22 (!) 107         Passed - Valid encounter within last 12 months    Recent Outpatient Visits           3 months ago Flank pain   [place] [facility] [place], [place], DO   5 months ago Encounter for [employer] annual wellness exam   [facility] [place], Megan P, DO   11 months ago Acute swimmer's ear of right side   [place] [facility] [place], Megan P, DO   11 months ago Chronic obstructive pulmonary disease, unspecified COPD type (HCC)   [place] [facility] Mecum, Erin E, PA-C   1 year ago Acute otitis media, unspecified otitis media type   [facility] Larae Grooms, NP       Future Appointments             In 3 days Laural Benes, Oralia Rud, DO [place] [facility]

## 2022-08-23 ENCOUNTER — Ambulatory Visit: Payer: 59 | Admitting: Family Medicine

## 2022-08-28 ENCOUNTER — Ambulatory Visit: Payer: Self-pay | Admitting: *Deleted

## 2022-08-28 NOTE — Patient Outreach (Signed)
  Care Coordination   Follow Up Visit Note   08/28/2022 Name: Andrea Kane MRN: [id number] DOB: [birth year]  Andrea Kane is a 52 y.o. year old female who sees Dorcas Carrow, DO for primary care. I spoke with  [name] by phone today.  What matters to the patients health and wellness today?  Patient states that she now has options for housing. Patient confirms that she has put an application for a low income apartment. She also has an option to move in to a mobile home within the next to  weeks. Patient currently followed by the Suboxone clinic([facility]), however would like ongoing mental health follow up. Patient agreeable to follow up at [facility] and will call to schedule an appointment [phone]    Goals Addressed             This Visit's Progress    "I need my own place to live"       Interventions Today    Flowsheet Row Most Recent Value  Chronic Disease   Chronic disease during today's visit Hypertension (HTN)  General Interventions   General Interventions Discussed/Reviewed General Interventions Reviewed, [employer]  [discussed housing options, mobile home with a roommate vs apartment(would have to register dog as a emotional support animal)]  Mental Health Interventions   Mental Health Discussed/Reviewed Mental Health Reviewed, Coping Strategies, Anxiety, Depression  [continues to be followed by Suboxone clinic-patient agreeable to follow up for therapy at [facility]-patient will call [phone]]  Safety Interventions   Safety Discussed/Reviewed Safety Reviewed  [patient encouraged to contact [phone] in the event of a mental health crisis]              SDOH assessments and interventions completed:  No     Care Coordination Interventions:  Yes, provided   Follow up plan: No further intervention required.   Encounter Outcome:  Pt. Visit Completed

## 2022-08-28 NOTE — Patient Instructions (Signed)
Visit Information  Thank you for taking time to visit with me today. Please don't hesitate to contact me if I can be of assistance to you.   Following are the goals we discussed today:   Goals Addressed             This Visit's Progress    "I need my own place to live"       Interventions Today    Flowsheet Row Most Recent Value  Chronic Disease   Chronic disease during today's visit Hypertension (HTN)  General Interventions   General Interventions Discussed/Reviewed General Interventions Reviewed, [employer]  [discussed housing options, mobile home with a roommate vs apartment(would have to register dog as a emotional support animal)]  Mental Health Interventions   Mental Health Discussed/Reviewed Mental Health Reviewed, Coping Strategies, Anxiety, Depression  [continues to be followed by Suboxone clinic-[facility]-patient agreeable to follow up for therapy at [facility]-patient will call [phone]]  Safety Interventions   Safety Discussed/Reviewed Safety Reviewed  [patient encouraged to contact [phone] in the event of a mental health crisis]             If you are experiencing a Mental Health or Behavioral Health Crisis or need someone to talk to, please call the Suicide and Crisis Lifeline: 988   Patient verbalizes understanding of instructions and care plan provided today and agrees to view in MyChart. Active MyChart status and patient understanding of how to access instructions and care plan via MyChart confirmed with patient.     No further follow up required: patient to contact this Child psychotherapist with any additional community resource needs  [employer], LCSW Clinical Social Worker  [facility] Care Management [phone]

## 2022-09-02 ENCOUNTER — Ambulatory Visit: Payer: Self-pay | Admitting: *Deleted

## 2022-09-02 ENCOUNTER — Encounter: Payer: Self-pay | Admitting: Family Medicine

## 2022-09-02 NOTE — Patient Outreach (Signed)
  Care Coordination   Follow Up Visit Note   09/02/2022 Name: Andrea Kane MRN: [id number] DOB: 04-Aug-1970  Andrea Kane is a 52 y.o. year old female who sees Dorcas Carrow, DO for primary care. I spoke with  [name] by phone today.  What matters to the patients health and wellness today?  Work to find better housing and then treat chronic conditions    Goals Addressed             This Visit's Progress    Effective management of HTN   Not on track    Care Coordination Interventions: Evaluation of current treatment plan related to hypertension self management and patient's adherence to plan as established by provider Provided education to patient re: stroke prevention, s/s of heart attack and stroke Reviewed medications with patient and discussed importance of compliance Discussed plans with patient for ongoing care management follow up and provided patient with direct contact information for care management team         SDOH assessments and interventions completed:  No     Care Coordination Interventions:  Yes, provided   Interventions Today    Flowsheet Row Most Recent Value  Chronic Disease   Chronic disease during today's visit Hypertension (HTN)  General Interventions   General Interventions Discussed/Reviewed Doctor Visits, General Interventions Reviewed  [Patient has had no shows for GYN and PCP offices in the last few months. State she has a lot of social issues going on, not able to focus on health. Advised that not care for her health increases risk for ED and admission, she verbalizes understanding.]  Doctor Visits Discussed/Reviewed Doctor Visits Reviewed, PCP  [No show for GYN on 1/19 & 4/19, no show for PCP on 5/21 & 5/24]       Follow up plan: Follow up call scheduled for 8/8    Encounter Outcome:  Pt. Visit Completed   Andrea Durie, RN, MSN, [facility] Care Management Care Management Coordinator [phone]

## 2022-10-15 ENCOUNTER — Encounter: Payer: Self-pay | Admitting: Nurse Practitioner

## 2022-10-15 ENCOUNTER — Ambulatory Visit (INDEPENDENT_AMBULATORY_CARE_PROVIDER_SITE_OTHER): Payer: 59 | Admitting: Nurse Practitioner

## 2022-10-15 VITALS — BP 135/87 | HR 71 | Temp 98.5°F | Ht 62.01 in | Wt 153.4 lb

## 2022-10-15 DIAGNOSIS — G5603 Carpal tunnel syndrome, bilateral upper limbs: Secondary | ICD-10-CM | POA: Diagnosis not present

## 2022-10-15 NOTE — Patient Instructions (Signed)
Carpal Tunnel Syndrome  Carpal tunnel syndrome is a condition that causes pain, weakness, and numbness in your hand and arm. Numbness is when you cannot feel an area in your body. The carpal tunnel is a narrow area that is on the palm side of your wrist. Repeated wrist motion or certain diseases may cause swelling in the tunnel. This swelling can pinch the main nerve in the wrist. This nerve is called the median nerve. What are the causes? This condition may be caused by: Moving your hand and wrist over and over again while doing a task. Injury to the wrist. Arthritis. A sac of fluid (cyst) or abnormal growth (tumor) in the carpal tunnel. Fluid buildup during pregnancy. Use of tools that vibrate. Sometimes the cause is not known. What increases the risk? The following factors may make you more likely to have this condition: Having a job that makes you do these things: Move your hand over and over again. Work with tools that vibrate, such as drills or sanders. Being a woman. Having diabetes, obesity, thyroid problems, or kidney failure. What are the signs or symptoms? Symptoms of this condition include: A tingling feeling in your fingers. Tingling or loss of feeling in your hand. Pain in your entire arm. This pain may get worse when you bend your wrist and elbow for a long time. Pain in your wrist that goes up your arm to your shoulder. Pain that goes down into your palm or fingers. Weakness in your hands. You may find it hard to grab and hold items. You may feel worse at night. How is this treated? This condition may be treated with: Lifestyle changes. You will be asked to stop or change the activity that caused your problem. Doing exercises and activities that make bones, muscles, and tendons stronger (physical therapy). Learning how to use your hand again (occupational therapy). Medicines for pain and swelling. You may have injections in your wrist. A wrist splint or  brace. Surgery. Follow these instructions at home: If you have a splint or brace: Wear the splint or brace as told by your doctor. Take it off only as told by your doctor. Loosen the splint if your fingers: Tingle. Become numb. Turn cold and blue. Keep the splint or brace clean. If the splint or brace is not waterproof: Do not let it get wet. Cover it with a watertight covering when you take a bath or a shower. Managing pain, stiffness, and swelling If told, put ice on the painful area: If you have a removable splint or brace, remove it as told by your doctor. Put ice in a plastic bag. Place a towel between your skin and the bag. Leave the ice on for 20 minutes, 2-3 times per day. Do not fall asleep with the cold pack on your skin. Take off the ice if your skin turns bright red. This is very important. If you cannot feel pain, heat, or cold, you have a greater risk of damage to the area. Move your fingers often to reduce stiffness and swelling. General instructions Take over-the-counter and prescription medicines only as told by your doctor. Rest your wrist from any activity that may cause pain. If needed, talk with your boss at work about changes that can help your wrist heal. Do exercises as told by your doctor, physical therapist, or occupational therapist. Keep all follow-up visits. Contact a doctor if: You have new symptoms. Medicine does not help your pain. Your symptoms get worse. Get help right  away if: You have very bad numbness or tingling in your wrist or hand. Summary Carpal tunnel syndrome is a condition that causes pain in your hand and arm. It is often caused by repeated wrist motions. Lifestyle changes and medicines are used to treat this problem. Surgery may help in very bad cases. Follow your doctor's instructions about wearing a splint, resting your wrist, keeping follow-up visits, and calling for help. This information is not intended to replace advice given  to you by your health care provider. Make sure you discuss any questions you have with your health care provider. Document Revised: 07/29/2019 Document Reviewed: 07/29/2019 Elsevier Patient Education  2024 [employer].
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
## 2022-10-15 NOTE — Assessment & Plan Note (Signed)
Ongoing for 5 months, R>L.  No red flags.  Obtain labs to update today: A1c, CBC, CMP, TSH, B12.  Recommend she wear hand/wrist support at night, which she has at home.  Continue simple treatment with Tylenol and Ibuprofen as needed.  May use Icy/Hot.  Recommend exercise at home to maintain strength.  Referral to ortho for further recommendations.

## 2022-10-16 LAB — CBC WITH DIFFERENTIAL/PLATELET
Basophils Absolute: 0.1 10*3/uL (ref 0.0–0.2)
Basos: 1 %
EOS (ABSOLUTE): 0.1 10*3/uL (ref 0.0–0.4)
Eos: 2 %
Hematocrit: 41.3 % (ref 34.0–46.6)
Hemoglobin: 14 g/dL (ref 11.1–15.9)
Immature Grans (Abs): 0 10*3/uL (ref 0.0–0.1)
Immature Granulocytes: 0 %
Lymphocytes Absolute: 2.4 10*3/uL (ref 0.7–3.1)
Lymphs: 34 %
MCH: 29.9 pg (ref 26.6–33.0)
MCHC: 33.9 g/dL (ref 31.5–35.7)
MCV: 88 fL (ref 79–97)
Monocytes Absolute: 0.6 10*3/uL (ref 0.1–0.9)
Monocytes: 9 %
Neutrophils Absolute: 3.9 10*3/uL (ref 1.4–7.0)
Neutrophils: 54 %
Platelets: 419 10*3/uL (ref 150–450)
RBC: 4.68 x10E6/uL (ref 3.77–5.28)
RDW: 12.4 % (ref 11.7–15.4)
WBC: 7.1 10*3/uL (ref 3.4–10.8)

## 2022-10-16 LAB — COMPREHENSIVE METABOLIC PANEL
ALT: 18 IU/L (ref 0–32)
AST: 23 IU/L (ref 0–40)
Albumin: 4.1 g/dL (ref 3.9–4.9)
Alkaline Phosphatase: 97 IU/L (ref 44–121)
BUN/Creatinine Ratio: 17 (ref 9–23)
BUN: 12 mg/dL (ref 6–24)
Bilirubin Total: <0.2 mg/dL (ref 0.0–1.2)
CO2: 22 mmol/L (ref 20–29)
Calcium: 9 mg/dL (ref 8.7–10.2)
Chloride: 101 mmol/L (ref 96–106)
Creatinine, Ser: 0.71 mg/dL (ref 0.57–1.00)
Globulin, Total: 2.3 g/dL (ref 1.5–4.5)
Glucose: 81 mg/dL (ref 70–99)
Potassium: 3.6 mmol/L (ref 3.5–5.2)
Sodium: 139 mmol/L (ref 134–144)
Total Protein: 6.4 g/dL (ref 6.0–8.5)
eGFR: 104 mL/min/{1.73_m2} (ref >59)

## 2022-10-16 LAB — HEMOGLOBIN A1C
Est. average glucose Bld gHb Est-mCnc: 114 mg/dL
Hgb A1c MFr Bld: 5.6 % (ref 4.8–5.6)

## 2022-10-16 LAB — VITAMIN B12: Vitamin B-12: 445 pg/mL (ref 232–1245)

## 2022-10-16 LAB — TSH: TSH: 1.77 u[IU]/mL (ref 0.450–4.500)

## 2022-10-16 NOTE — Progress Notes (Signed)
Contacted via MyChart -- although appears not to check consistently, so please call:   Good morning Andrea Kane, your labs have returned and overall are nice and normal.  Great news!!

## 2022-10-24 ENCOUNTER — Other Ambulatory Visit: Payer: Self-pay | Admitting: Family Medicine

## 2022-10-24 DIAGNOSIS — J449 Chronic obstructive pulmonary disease, unspecified: Secondary | ICD-10-CM

## 2022-10-24 NOTE — Telephone Encounter (Signed)
Requested medication (s) are due for refill today - yes  Requested medication (s) are on the active medication list -yes  Future visit scheduled -no  Last refill: 03/14/22 1ea 5RF  Notes to clinic: off protocol- provider review   Requested Prescriptions  Pending Prescriptions Disp Refills   TRELEGY ELLIPTA 100-62.5-25 MCG/ACT AEPB [Pharmacy Med Name: TRELEGY ELLIPTA 100-62. INH 30P] 60 each     Sig: Inhale 1 puff into the lungs daily.     Off-Protocol Failed - 10/24/2022  3:40 AM      Failed - Medication not assigned to a protocol, review manually.      Passed - Valid encounter within last 12 months    Recent Outpatient Visits           1 week ago Bilateral carpal tunnel syndrome   [place] [facility] [place], [place] T, NP   5 months ago Flank pain   [place] [facility] [place], Megan P, DO   7 months ago Encounter for [employer] annual wellness exam   [facility] [place], [place] P, DO   1 year ago Acute swimmer's ear of right side   [place] [facility] [place], Megan P, DO   1 year ago Chronic obstructive pulmonary disease, unspecified COPD type (HCC)   [place] [facility] Mecum, Oswaldo Conroy, PA-C                 Requested Prescriptions  Pending Prescriptions Disp Refills   TRELEGY ELLIPTA 100-62.5-25 MCG/ACT AEPB [Pharmacy Med Name: TRELEGY ELLIPTA 100-62. INH 30P] 60 each     Sig: Inhale 1 puff into the lungs daily.     Off-Protocol Failed - 10/24/2022  3:40 AM      Failed - Medication not assigned to a protocol, review manually.      Passed - Valid encounter within last 12 months    Recent Outpatient Visits           1 week ago Bilateral carpal tunnel syndrome   [facility] [place], [place] T, NP   5 months ago Flank pain   [place] [facility] [place], Megan P, DO   7 months ago Encounter for [employer] annual wellness exam    [place] [facility] [place], [place] P, DO   1 year ago Acute swimmer's ear of right side    [facility] [place], Megan P, DO   1 year ago Chronic obstructive pulmonary disease, unspecified COPD type [facility])    [facility] Mecum, Oswaldo Conroy, PA-C

## 2022-10-28 NOTE — Telephone Encounter (Signed)
Attempted to reach patient via phone and MyChart so that she can get scheduled to get a refill on her inhaler.  Pt is overdue for follow up appointment.  Put in CRM.

## 2022-11-07 ENCOUNTER — Ambulatory Visit: Payer: Self-pay | Admitting: *Deleted

## 2022-11-07 NOTE — Patient Outreach (Signed)
  Care Coordination   11/07/2022 Name: Andrea Kane MRN: [id number] DOB: [birth year]   Care Coordination Outreach Attempts:  An unsuccessful telephone outreach was attempted for a scheduled appointment today.  Follow Up Plan:  Additional outreach attempts will be made to offer the patient care coordination information and services.   Encounter Outcome:  No Answer   Care Coordination Interventions:  No, not indicated    Kemper Durie, RN, MSN, [facility] Care Management Care Management Coordinator [phone]

## 2022-11-19 ENCOUNTER — Telehealth: Payer: Self-pay | Admitting: *Deleted

## 2022-11-19 NOTE — Progress Notes (Signed)
Care Coordination Note  11/19/2022 Name: Andrea Kane MRN: [id number] DOB: Jul 20, 1970  Andrea Kane is a 52 y.o. year old female who is a primary care patient of Dorcas Carrow, DO and is actively engaged with the care management team. I reached out to [name] by phone today to assist with re-scheduling a follow up visit with the RN Case Manager  Follow up plan: Unsuccessful telephone outreach attempt made. A HIPAA compliant phone message was left for the patient providing contact information and requesting a return call.   Burman Nieves, CCMA Care Coordination Care Guide Direct Dial: [phone]

## 2022-11-20 ENCOUNTER — Other Ambulatory Visit: Payer: Self-pay | Admitting: Nurse Practitioner

## 2022-11-20 DIAGNOSIS — J449 Chronic obstructive pulmonary disease, unspecified: Secondary | ICD-10-CM

## 2022-11-20 NOTE — Telephone Encounter (Signed)
Requested medication (s) are due for refill today- yes  Requested medication (s) are on the active medication list -yes  Future visit scheduled -no  Last refill: 10/24/22 60 each  Notes to clinic: off protocol- provider review   Requested Prescriptions  Pending Prescriptions Disp Refills   TRELEGY ELLIPTA 100-62.5-25 MCG/ACT AEPB [Pharmacy Med Name: TRELEGY ELLIPTA 100-62. INH 30P] 60 each 0    Sig: INHALE 1 PUFF INTO THE LUNGS DAILY     Off-Protocol Failed - 11/20/2022  3:41 AM      Failed - Medication not assigned to a protocol, review manually.      Passed - Valid encounter within last 12 months    Recent Outpatient Visits           1 month ago Bilateral carpal tunnel syndrome   [place] [place], Corrie Dandy T, NP   6 months ago Flank pain   [facility] [place], Megan P, DO   8 months ago Encounter for [employer] annual wellness exam   [place] [facility] [place], [place] P, DO   1 year ago Acute swimmer's ear of right side   [place] [facility] [place], Megan P, DO   1 year ago Chronic obstructive pulmonary disease, unspecified COPD type (HCC)   [place] [facility] Mecum, Oswaldo Conroy, PA-C                 Requested Prescriptions  Pending Prescriptions Disp Refills   TRELEGY ELLIPTA 100-62.5-25 MCG/ACT AEPB [Pharmacy Med Name: TRELEGY ELLIPTA 100-62. INH 30P] 60 each 0    Sig: INHALE 1 PUFF INTO THE LUNGS DAILY     Off-Protocol Failed - 11/20/2022  3:41 AM      Failed - Medication not assigned to a protocol, review manually.      Passed - Valid encounter within last 12 months    Recent Outpatient Visits           1 month ago Bilateral carpal tunnel syndrome   [place] [facility] [place], Corrie Dandy T, NP   6 months ago Flank pain   [place] [facility] [place], Megan P, DO   8 months ago Encounter for [employer] annual wellness exam    [place] [facility] [place], [place] P, DO   1 year ago Acute swimmer's ear of right side    [facility] [place], Megan P, DO   1 year ago Chronic obstructive pulmonary disease, unspecified COPD type [facility])    [facility] Mecum, Oswaldo Conroy, PA-C

## 2022-12-18 ENCOUNTER — Other Ambulatory Visit: Payer: Self-pay | Admitting: Nurse Practitioner

## 2022-12-18 DIAGNOSIS — J449 Chronic obstructive pulmonary disease, unspecified: Secondary | ICD-10-CM

## 2022-12-19 NOTE — Telephone Encounter (Signed)
Requested Prescriptions  Pending Prescriptions Disp Refills   TRELEGY ELLIPTA 100-62.5-25 MCG/ACT AEPB [Pharmacy Med Name: TRELEGY ELLIPTA 100-62. INH 30P] 60 each 0    Sig: INHALE 1 PUFF INTO THE LUNGS DAILY     Off-Protocol Failed - 12/18/2022  3:39 AM      Failed - Medication not assigned to a protocol, review manually.      Passed - Valid encounter within last 12 months    Recent Outpatient Visits           2 months ago Bilateral carpal tunnel syndrome   [place] [facility] [place], Corrie Dandy T, NP   7 months ago Flank pain   [place] [facility] [place], Megan P, DO   9 months ago Encounter for [employer] annual wellness exam   [place] [facility] [place], [place] P, DO   1 year ago Acute swimmer's ear of right side   [place] [facility] [place], Megan P, DO   1 year ago Chronic obstructive pulmonary disease, unspecified COPD type (HCC)   [place] [facility] Mecum, Oswaldo Conroy, PA-C       Future Appointments             In 3 weeks [place] Benes, Oralia Rud, DO [place] [facility]

## 2022-12-27 NOTE — Progress Notes (Signed)
  Care Coordination Note  12/27/2022 Name: Andrea Kane MRN: [id number] DOB: 12/02/1970  Andrea Kane is a 52 y.o. year old female who is a primary care patient of Dorcas Carrow, DO and is actively engaged with the care management team. I reached out to [name] by phone today to assist with re-scheduling a follow up visit with the RN Case Manager  Follow up plan: We have been unable to make contact with the patient for follow up.   Burman Nieves, CCMA Care Coordination Care Guide Direct Dial: [phone]

## 2023-01-09 ENCOUNTER — Ambulatory Visit: Payer: 59 | Admitting: Family Medicine

## 2023-01-13 ENCOUNTER — Telehealth: Payer: Self-pay | Admitting: Family Medicine

## 2023-01-13 NOTE — Telephone Encounter (Unsigned)
Copied from CRM [id number]. Topic: Medical Record Request - Other >> Jan 13, 2023 12:31 PM Payton Doughty wrote: Reason for CRM: erin calling back b/c they have not received anything.  She said it is not even seen anything in [employer]. Denny Peon would like to speak w/ Iris concerning this. Please Call back [phone]

## 2023-01-13 NOTE — Telephone Encounter (Signed)
Spoke to [place] sent record for DOS 10/15/2022 and resent request for billing for the same DOS form [employer].

## 2023-01-15 NOTE — Telephone Encounter (Signed)
Contacted Erin and explained the DOS requested is from Hospital [facility] and will need to requested from them. Follow up office note were sent  and billing was requested from [employer].

## 2023-01-15 NOTE — Telephone Encounter (Signed)
Copied from CRM [id number]. Topic: Medical Record Request - Other >> Jan 13, 2023 12:31 PM Payton Doughty wrote: Reason for CRM: erin calling back b/c they have not received anything.  She said it is not even seen anything in [employer]. Denny Peon would like to speak w/ Iris concerning this. Please Call back [phone]

## 2023-01-16 ENCOUNTER — Other Ambulatory Visit: Payer: Self-pay | Admitting: Nurse Practitioner

## 2023-01-16 DIAGNOSIS — J449 Chronic obstructive pulmonary disease, unspecified: Secondary | ICD-10-CM

## 2023-01-16 NOTE — Telephone Encounter (Signed)
Needs appt

## 2023-01-16 NOTE — Telephone Encounter (Signed)
Requested medication (s) are due for refill today: yes  Requested medication (s) are on the active medication list: yes  Last refill:  12/19/22  Future visit scheduled: no  Notes to clinic:  Medication not assigned to a protocol, review manually.       Requested Prescriptions  Pending Prescriptions Disp Refills   TRELEGY ELLIPTA 100-62.5-25 MCG/ACT AEPB [Pharmacy Med Name: TRELEGY ELLIPTA 100-62. INH 30P] 60 each 0    Sig: INHALE 1 PUFF INTO THE LUNGS DAILY     Off-Protocol Failed - 01/16/2023  3:38 AM      Failed - Medication not assigned to a protocol, review manually.      Passed - Valid encounter within last 12 months    Recent Outpatient Visits           3 months ago Bilateral carpal tunnel syndrome   [place] [facility] [place], [place] T, NP   8 months ago Flank pain   [place] [place], Megan P, DO   10 months ago Encounter for [employer] annual wellness exam   [place] [facility] [place], [place] P, DO   1 year ago Acute swimmer's ear of right side   [place] [facility] [place], Megan P, DO   1 year ago Chronic obstructive pulmonary disease, unspecified COPD type [facility])   [place] [facility] Mecum, Oswaldo Conroy, PA-C

## 2023-03-06 ENCOUNTER — Telehealth: Payer: Self-pay | Admitting: Family Medicine

## 2023-03-06 NOTE — Telephone Encounter (Signed)
Copied from CRM [id number]. Topic: Medicare AWV >> Mar 06, 2023 10:16 AM Payton Doughty wrote: Reason for CRM: Called 03/05/2024 to sched Annual Wellness Visit - NO VOICEMAIL  Verlee Rossetti; Care Guide Ambulatory Clinical Support [place] [facility] Direct Dial: [phone]

## 2023-04-08 ENCOUNTER — Other Ambulatory Visit: Payer: Self-pay | Admitting: Nurse Practitioner

## 2023-04-08 DIAGNOSIS — J449 Chronic obstructive pulmonary disease, unspecified: Secondary | ICD-10-CM

## 2023-04-09 NOTE — Telephone Encounter (Signed)
 Patient is overdue for an appointment with PCP. Please call to schedule and then route to provider for refill.

## 2023-04-09 NOTE — Telephone Encounter (Signed)
 Requested medication (s) are due for refill today - yes  Requested medication (s) are on the active medication list -yes  Future visit scheduled -no  Last refill: 12/19/22 60 each  Notes to clinic: off protocol- provider review   Requested Prescriptions  Pending Prescriptions Disp Refills   TRELEGY ELLIPTA  100-62.5-25 MCG/ACT AEPB [Pharmacy Med Name: TRELEGY ELLIPTA  100-62. INH 30P] 60 each 0    Sig: INHALE 1 PUFF INTO THE LUNGS DAILY     Off-Protocol Failed - 04/09/2023  8:29 AM      Failed - Medication not assigned to a protocol, review manually.      Passed - Valid encounter within last 12 months    Recent Outpatient Visits           5 months ago Bilateral carpal tunnel syndrome   [place] [facility] [place], Melanie DASEN, NP   11 months ago Flank pain   [place] [facility] [place], [place], DO   1 year ago Encounter for [employer] annual wellness exam   [place] [facility] [place], [place] P, DO   1 year ago Acute swimmer's ear of right side   [place] [facility] [place], Megan P, DO   1 year ago Chronic obstructive pulmonary disease, unspecified COPD type (HCC)   [place] [facility] Mecum, [place] E, PA-C                 Requested Prescriptions  Pending Prescriptions Disp Refills   TRELEGY ELLIPTA  100-62.5-25 MCG/ACT AEPB [Pharmacy Med Name: TRELEGY ELLIPTA  100-62. INH 30P] 60 each 0    Sig: INHALE 1 PUFF INTO THE LUNGS DAILY     Off-Protocol Failed - 04/09/2023  8:29 AM      Failed - Medication not assigned to a protocol, review manually.      Passed - Valid encounter within last 12 months    Recent Outpatient Visits           5 months ago Bilateral carpal tunnel syndrome   [place] [facility] [place], Melanie DASEN, NP   11 months ago Flank pain   [facility], [place], DO   1 year ago Encounter for [employer] annual wellness exam    [place] [facility] [place], [place] P, DO   1 year ago Acute swimmer's ear of right side   [facility] [place], Megan P, DO   1 year ago Chronic obstructive pulmonary disease, unspecified COPD type [facility])    [facility] Mecum, [place] E, PA-C

## 2023-04-09 NOTE — Telephone Encounter (Signed)
 Attempted to reach patient via phone the number on file states that it can't be completed at this time, sent patient a message on MyChart to call office back to receive further refills.

## 2023-04-27 ENCOUNTER — Emergency Department
Admission: EM | Admit: 2023-04-27 | Discharge: 2023-04-27 | Disposition: A | Discharge status: Home / Self Care | Payer: 59 | Attending: Emergency Medicine | Admitting: Emergency Medicine

## 2023-04-27 DIAGNOSIS — R590 Localized enlarged lymph nodes: Secondary | ICD-10-CM | POA: Insufficient documentation

## 2023-04-27 DIAGNOSIS — J449 Chronic obstructive pulmonary disease, unspecified: Secondary | ICD-10-CM | POA: Insufficient documentation

## 2023-04-27 DIAGNOSIS — R509 Fever, unspecified: Secondary | ICD-10-CM | POA: Diagnosis not present

## 2023-04-27 DIAGNOSIS — F419 Anxiety disorder, unspecified: Secondary | ICD-10-CM | POA: Insufficient documentation

## 2023-04-27 DIAGNOSIS — Z20822 Contact with and (suspected) exposure to covid-19: Secondary | ICD-10-CM | POA: Insufficient documentation

## 2023-04-27 DIAGNOSIS — J020 Streptococcal pharyngitis: Secondary | ICD-10-CM | POA: Insufficient documentation

## 2023-04-27 DIAGNOSIS — J029 Acute pharyngitis, unspecified: Secondary | ICD-10-CM | POA: Diagnosis present

## 2023-04-27 LAB — BASIC METABOLIC PANEL
Anion gap: 10 (ref 5–15)
BUN: 11 mg/dL (ref 6–20)
CO2: 26 mmol/L (ref 22–32)
Calcium: 8.6 mg/dL — ABNORMAL LOW (ref 8.9–10.3)
Chloride: 97 mmol/L — ABNORMAL LOW (ref 98–111)
Creatinine, Ser: 0.65 mg/dL (ref 0.44–1.00)
GFR, Estimated: >60 mL/min (ref >60)
Glucose, Bld: 135 mg/dL — ABNORMAL HIGH (ref 70–99)
Potassium: 3.6 mmol/L (ref 3.5–5.1)
Sodium: 133 mmol/L — ABNORMAL LOW (ref 135–145)

## 2023-04-27 LAB — CBC WITH DIFFERENTIAL/PLATELET
Abs Immature Granulocytes: 0.04 10*3/uL (ref 0.00–0.07)
Basophils Absolute: 0.1 10*3/uL (ref 0.0–0.1)
Basophils Relative: 1 %
Eosinophils Absolute: 0.2 10*3/uL (ref 0.0–0.5)
Eosinophils Relative: 1 %
HCT: 39.9 % (ref 36.0–46.0)
Hemoglobin: 13.4 g/dL (ref 12.0–15.0)
Immature Granulocytes: 0 %
Lymphocytes Relative: 15 %
Lymphs Abs: 2.1 10*3/uL (ref 0.7–4.0)
MCH: 30 pg (ref 26.0–34.0)
MCHC: 33.6 g/dL (ref 30.0–36.0)
MCV: 89.5 fL (ref 80.0–100.0)
Monocytes Absolute: 1.2 10*3/uL — ABNORMAL HIGH (ref 0.1–1.0)
Monocytes Relative: 9 %
Neutro Abs: 10.9 10*3/uL — ABNORMAL HIGH (ref 1.7–7.7)
Neutrophils Relative %: 74 %
Platelets: 286 10*3/uL (ref 150–400)
RBC: 4.46 MIL/uL (ref 3.87–5.11)
RDW: 13 % (ref 11.5–15.5)
WBC: 14.5 10*3/uL — ABNORMAL HIGH (ref 4.0–10.5)
nRBC: 0 % (ref 0.0–0.2)

## 2023-04-27 LAB — RESP PANEL BY RT-PCR (RSV, FLU A&B, COVID)  RVPGX2
Influenza A by PCR: NEGATIVE
Influenza B by PCR: NEGATIVE
Resp Syncytial Virus by PCR: NEGATIVE
SARS Coronavirus 2 by RT PCR: NEGATIVE

## 2023-04-27 LAB — GROUP A STREP BY PCR: Group A Strep by PCR: DETECTED — AB

## 2023-04-27 LAB — MONONUCLEOSIS SCREEN: Mono Screen: NEGATIVE

## 2023-04-27 MED ORDER — AMOXICILLIN 500 MG PO CAPS: 500.0000 mg | ORAL_CAPSULE | Freq: Three times a day (TID) | ORAL | 0 refills | Status: DC

## 2023-04-27 NOTE — ED Provider Notes (Signed)
[facility] Provider Note    Event Date/Time   First MD Initiated Contact with Patient 04/27/23 1319     (approximate)   History   Sore Throat   HPI  Andrea Kane is a 53 y.o. female with history of COPD, anxiety presents emergency department complaint of sore throat.  Patient recently traveled to [place].  States has a large lymph node at the posterior part of her neck.  States she usually this indicates she is getting sick.  No vomiting or diarrhea.  Symptoms started about 3 days ago      Physical Exam   Triage Vital Signs: ED Triage Vitals [04/27/23 1204]  Encounter Vitals Group     BP (!) 167/85     Systolic BP Percentile      Diastolic BP Percentile      Pulse Rate 80     Resp 18     Temp 98.7 F (37.1 C)     Temp Source Oral     SpO2 95 %     Weight 153 lb (69.4 kg)     Height 5\' 2"  (1.575 m)     Head Circumference      Peak Flow      Pain Score 6     Pain Loc      Pain Education      Exclude from Growth Chart     Most recent vital signs: Vitals:   04/27/23 1204  BP: (!) 167/85  Pulse: 80  Resp: 18  Temp: 98.7 F (37.1 C)  SpO2: 95%     General: Awake, no distress.   CV:  Good peripheral perfusion. regular rate and  rhythm Resp:  Normal effort. Lungs CTA Abd:  No distention.   Other:      ED Results / Procedures / Treatments   Labs (all labs ordered are listed, but only abnormal results are displayed) Labs Reviewed  GROUP A STREP BY PCR - Abnormal; Notable for the following components:      Result Value   Group A Strep by PCR DETECTED (*)    All other components within normal limits  BASIC METABOLIC PANEL - Abnormal; Notable for the following components:   Sodium 133 (*)    Chloride 97 (*)    Glucose, Bld 135 (*)    Calcium 8.6 (*)    All other components within normal limits  CBC WITH DIFFERENTIAL/PLATELET - Abnormal; Notable for the following components:   WBC 14.5 (*)    Neutro Abs 10.9 (*)     Monocytes Absolute 1.2 (*)    All other components within normal limits  RESP PANEL BY RT-PCR (RSV, FLU A&B, COVID)  RVPGX2  MONONUCLEOSIS SCREEN     EKG     RADIOLOGY     PROCEDURES:   Procedures Chief Complaint  Patient presents with   Sore Throat      MEDICATIONS ORDERED IN ED: Medications - No data to display   IMPRESSION / MDM / ASSESSMENT AND PLAN / ED COURSE  I reviewed the triage vital signs and the nursing notes.                              Differential diagnosis includes, but is not limited to, COVID, influenza, RSV, strep throat, mono  Patient's presentation is most consistent with acute illness / injury with system symptoms.   Due to the very enlarged lymph  node went ahead and did basic labs along with respiratory panel, monotest, and strep test  Respiratory panel and monotest were reassuring, CBC has elevated WBC of 14.5 indicating infection, basic metabolic panel is reassuring, strep test is positive  I did explain all findings to the patient.  She was given a prescription for amoxicillin.  She is to follow-up with her regular doctor.  Return emergency department if unable to swallow liquids.  She is in agreement treatment plan.  Discharged stable condition.      FINAL CLINICAL IMPRESSION(S) / ED DIAGNOSES   Final diagnoses:  Acute streptococcal pharyngitis     Rx / DC Orders   ED Discharge Orders          Ordered    amoxicillin (AMOXIL) 500 MG capsule  3 times daily        04/27/23 1328             Note:  This document was prepared using Dragon voice recognition software and may include unintentional dictation errors.    Faythe Ghee, PA-C 04/27/23 1411    Minna Antis, MD 04/27/23 [phone]

## 2023-04-27 NOTE — ED Triage Notes (Signed)
Pt c/o sore throat x3 days.  Pain score 6/10.  Pt reports she recently went out of town and she typically gets a sore throat when she travels.   Pt reports taking Tylenol around 1000.

## 2023-04-27 NOTE — ED Provider Triage Note (Signed)
Emergency Medicine Provider Triage Evaluation Note  Andrea Kane , a 54 y.o. female  was evaluated in triage.  Pt complains of sore throat, large lymph node on the back of her neck, fever chills body aches.  Recently traveled to [place].  Review of Systems  Positive:  Negative:   Physical Exam  BP (!) 167/85 (BP Location: Left Arm)   Pulse 80   Temp 98.7 F (37.1 C) (Oral)   Resp 18   SpO2 95%  Gen:   Awake, no distress   Resp:  Normal effort  MSK:   Moves extremities without difficulty  Other:    Medical Decision Making  Medically screening exam initiated at 12:04 PM.  Appropriate orders placed.  Andrea Kane was informed that the remainder of the evaluation will be completed by another provider, this initial triage assessment does not replace that evaluation, and the importance of remaining in the ED until their evaluation is complete.     Faythe Ghee, PA-C 04/27/23 1205

## 2023-04-28 ENCOUNTER — Telehealth: Payer: Self-pay

## 2023-04-28 NOTE — Transitions of Care (Post Inpatient/ED Visit) (Unsigned)
   04/28/2023  Name: Andrea Kane MRN: [id number] DOB: 11-Sep-1970  Today's TOC FU Call Status: Today's TOC FU Call Status:: Unsuccessful Call (1st Attempt) Unsuccessful Call (1st Attempt) Date: 04/28/23  Attempted to reach the patient regarding the most recent Inpatient/ED visit.  Follow Up Plan: Additional outreach attempts will be made to reach the patient to complete the Transitions of Care (Post Inpatient/ED visit) call.   Signature Karena Addison, LPN [facility] Nurse Health Advisor Direct Dial [phone]

## 2023-04-29 NOTE — Transitions of Care (Post Inpatient/ED Visit) (Signed)
   04/29/2023  Name: Andrea Kane MRN: [id number] DOB: 02-23-1971  Today's TOC FU Call Status: Today's TOC FU Call Status:: Successful TOC FU Call Completed Unsuccessful Call (1st Attempt) Date: 04/28/23 [facility] FU Call Complete Date: 04/29/23 Patient's Name and Date of Birth confirmed.  Transition Care Management Follow-up Telephone Call Date of Discharge: 04/27/23 Discharge [facility]) Type of Discharge: Emergency Department Reason for ED Visit: Other: (pharyngitis) How have you been since you were released from the hospital?: Better Any questions or concerns?: No  Items Reviewed: Did you receive and understand the discharge instructions provided?: Yes Medications obtained,verified, and reconciled?: Yes (Medications Reviewed) Any new allergies since your discharge?: No Dietary orders reviewed?: Yes Do you have support at home?: No  Medications Reviewed Today: Medications Reviewed Today     Reviewed by Karena Addison, LPN (Licensed Practical Nurse) on 04/29/23 at 1549  Med List Status: <None>   Medication Order Taking? Sig Documenting Provider Last Dose Status Informant  albuterol (VENTOLIN HFA) 108 (90 Base) MCG/ACT inhaler [id number] No INHALE 2 PUFFS INTO THE LUNGS EVERY 6 HOURS AS NEEDED FOR WHEEZING OR SHORTNESS OF BREATH Johnson, Megan P, DO Taking Active   amoxicillin (AMOXIL) 500 MG capsule [id number]  Take 1 capsule (500 mg total) by mouth 3 (three) times daily. Faythe Ghee, PA-C  Active   citalopram (CELEXA) 10 MG tablet [id number] No Take 1 tablet (10 mg total) by mouth daily. Johnson, Megan P, DO Taking Active   TRELEGY ELLIPTA 100-62.5-25 MCG/ACT AEPB [id number]  INHALE 1 PUFF INTO THE LUNGS DAILY Dorcas Carrow, DO  Active             Home Care and Equipment/Supplies: Were Home Health Services Ordered?: NA Any new equipment or medical supplies ordered?: NA  Functional Questionnaire: Do you need assistance with  bathing/showering or dressing?: No Do you need assistance with meal preparation?: No Do you need assistance with eating?: No Do you have difficulty maintaining continence: No Do you need assistance with getting out of bed/getting out of a chair/moving?: No Do you have difficulty managing or taking your medications?: No  Follow up appointments reviewed: PCP Follow-up appointment confirmed?: No (no avail , sent to staff to schedule) MD Provider Line Number:[phone] Given: No Specialist Hospital Follow-up appointment confirmed?: NA Do you need transportation to your follow-up appointment?: No Do you understand care options if your condition(s) worsen?: Yes-patient verbalized understanding    SIGNATURE Karena Addison, LPN [facility] Nurse Health Advisor Direct Dial [phone]

## 2023-05-16 ENCOUNTER — Other Ambulatory Visit: Payer: Self-pay | Admitting: Family Medicine

## 2023-05-16 DIAGNOSIS — J449 Chronic obstructive pulmonary disease, unspecified: Secondary | ICD-10-CM

## 2023-05-16 NOTE — Telephone Encounter (Signed)
Requested medication (s) are due for refill today: Yes  Requested medication (s) are on the active medication list: Yes  Last refill:  04/10/23  Future visit scheduled: No  Notes to clinic:  Unable to refill due to no refill protocol for this medication.      Requested Prescriptions  Pending Prescriptions Disp Refills   TRELEGY ELLIPTA 100-62.5-25 MCG/ACT AEPB [Pharmacy Med Name: TRELEGY ELLIPTA 100-62. INH 30P] 60 each 0    Sig: INHALE 1 PUFF INTO THE LUNGS DAILY     Off-Protocol Failed - 05/16/2023  1:42 PM      Failed - Medication not assigned to a protocol, review manually.      Passed - Valid encounter within last 12 months    Recent Outpatient Visits           7 months ago Bilateral carpal tunnel syndrome   [place] [facility] Teller, Corrie Dandy T, NP   1 year ago Flank pain   [facility] Peterman, [place], DO   1 year ago Encounter for [employer] annual wellness exam   [facility] [place], [place] P, DO   1 year ago Acute swimmer's ear of right side   [facility] [place], Megan P, DO   1 year ago Chronic obstructive pulmonary disease, unspecified COPD type [facility])    [facility] Mecum, Oswaldo Conroy, PA-C

## 2023-05-19 NOTE — Telephone Encounter (Signed)
 Patient has not been seen for regular appointment since 2023. Needs follow up appointment.

## 2023-07-18 ENCOUNTER — Other Ambulatory Visit: Payer: Self-pay | Admitting: Family Medicine

## 2023-07-18 NOTE — Telephone Encounter (Signed)
 Requested medications are due for refill today.  yes  Requested medications are on the active medications list.  Citalopram  is. Abilify  is not  Last refill. Citalopram  05/03/2022 #30 3 rf, Abilify  05/03/2022  Future visit scheduled.   no  Notes to clinic.  Abilify  was d/c'd. Refill/refusal of this medication not delegated. Pt is more than 3 months overdue for refill of citalopram . Labs are expired.    Requested Prescriptions  Pending Prescriptions Disp Refills   citalopram  (CELEXA ) 10 MG tablet [Pharmacy Med Name: CITALOPRAM  10MG  TABLETS] 30 tablet 3    Sig: TAKE 1 TABLET(10 MG) BY MOUTH DAILY     Psychiatry:  Antidepressants - SSRI Failed - 07/18/2023  1:47 PM      Failed - Valid encounter within last 6 months    Recent Outpatient Visits   None            Passed - Completed PHQ-2 or PHQ-9 in the last 360 days       ARIPiprazole  (ABILIFY ) 2 MG tablet [Pharmacy Med Name: ARIPIPRAZOLE  2MG  TABLETS] 30 tablet 3    Sig: TAKE 1 TABLET(2 MG) BY MOUTH DAILY AS NEEDED     Not Delegated - Psychiatry:  Antipsychotics - Second Generation (Atypical) - aripiprazole  Failed - 07/18/2023  1:47 PM      Failed - This refill cannot be delegated      Failed - Last BP in normal range    BP Readings from Last 1 Encounters:  04/27/23 (!) 167/85         Failed - Valid encounter within last 6 months    Recent Outpatient Visits   None            Failed - Lipid Panel in normal range within the last 12 months    Cholesterol, Total  Date Value Ref Range Status  03/14/2022 190 100 - 199 mg/dL Final   LDL Chol Calc (NIH)  Date Value Ref Range Status  03/14/2022 94 0 - 99 mg/dL Final   HDL  Date Value Ref Range Status  03/14/2022 79 >39 mg/dL Final   Triglycerides  Date Value Ref Range Status  03/14/2022 93 0 - 149 mg/dL Final         Passed - TSH in normal range and within 360 days    TSH  Date Value Ref Range Status  10/15/2022 1.770 0.450 - 4.500 uIU/mL Final         Passed -  Completed PHQ-2 or PHQ-9 in the last 360 days      Passed - Last Heart Rate in normal range    Pulse Readings from Last 1 Encounters:  04/27/23 80         Passed - CBC within normal limits and completed in the last 12 months    WBC  Date Value Ref Range Status  04/27/2023 14.5 (H) 4.0 - 10.5 K/uL Final   RBC  Date Value Ref Range Status  04/27/2023 4.46 3.87 - 5.11 MIL/uL Final   Hemoglobin  Date Value Ref Range Status  04/27/2023 13.4 12.0 - 15.0 g/dL Final  40/98/1191 47.8 11.1 - 15.9 g/dL Final   HCT  Date Value Ref Range Status  04/27/2023 39.9 36.0 - 46.0 % Final   Hematocrit  Date Value Ref Range Status  10/15/2022 41.3 34.0 - 46.6 % Final   MCHC  Date Value Ref Range Status  04/27/2023 33.6 30.0 - 36.0 g/dL Final   [facility]  Date Value Ref Range Status  04/27/2023 30.0 26.0 - 34.0 pg Final   MCV  Date Value Ref Range Status  04/27/2023 89.5 80.0 - 100.0 fL Final  10/15/2022 88 79 - 97 fL Final   No results found for: "PLTCOUNTKUC", "LABPLAT", "POCPLA" RDW  Date Value Ref Range Status  04/27/2023 13.0 11.5 - 15.5 % Final  10/15/2022 12.4 11.7 - 15.4 % Final         Passed - CMP within normal limits and completed in the last 12 months    Albumin  Date Value Ref Range Status  10/15/2022 4.1 3.9 - 4.9 g/dL Final   Alkaline Phosphatase  Date Value Ref Range Status  10/15/2022 97 44 - 121 IU/L Final   ALT  Date Value Ref Range Status  10/15/2022 18 0 - 32 IU/L Final   AST  Date Value Ref Range Status  10/15/2022 23 0 - 40 IU/L Final   BUN  Date Value Ref Range Status  04/27/2023 11 6 - 20 mg/dL Final  16/12/9602 12 6 - 24 mg/dL Final   Calcium  Date Value Ref Range Status  04/27/2023 8.6 (L) 8.9 - 10.3 mg/dL Final   CO2  Date Value Ref Range Status  04/27/2023 26 22 - 32 mmol/L Final   Creatinine, Ser  Date Value Ref Range Status  04/27/2023 0.65 0.44 - 1.00 mg/dL Final   Glucose, Bld  Date Value Ref Range Status  04/27/2023 135 (H) 70  - 99 mg/dL Final    Comment:    Glucose reference range applies only to samples taken after fasting for at least 8 hours.   Potassium  Date Value Ref Range Status  04/27/2023 3.6 3.5 - 5.1 mmol/L Final   Sodium  Date Value Ref Range Status  04/27/2023 133 (L) 135 - 145 mmol/L Final  10/15/2022 139 134 - 144 mmol/L Final   Bilirubin Total  Date Value Ref Range Status  10/15/2022 <0.2 0.0 - 1.2 mg/dL Final   Protein, ur  Date Value Ref Range Status  10/08/2016 NEGATIVE NEGATIVE mg/dL Final   Protein,UA  Date Value Ref Range Status  03/14/2022 Negative Negative/Trace Final   Total Protein  Date Value Ref Range Status  10/15/2022 6.4 6.0 - 8.5 g/dL Final   GFR calc Af Amer  Date Value Ref Range Status  09/14/2018 117 >59 mL/min/1.73 Final   eGFR  Date Value Ref Range Status  10/15/2022 104 >59 mL/min/1.73 Final   GFR, Estimated  Date Value Ref Range Status  04/27/2023 >60 >60 mL/min Final    Comment:    (NOTE) Calculated using the CKD-EPI Creatinine Equation (2021)

## 2023-11-18 DIAGNOSIS — H2513 Age-related nuclear cataract, bilateral: Secondary | ICD-10-CM | POA: Diagnosis not present

## 2023-12-11 ENCOUNTER — Other Ambulatory Visit: Payer: Self-pay | Admitting: Family Medicine

## 2023-12-11 NOTE — Telephone Encounter (Signed)
 Requested medications are due for refill today.  yes  Requested medications are on the active medications list.  yes  Last refill. 08/20/2022 8.5 2 rf  Future visit scheduled.   no  Notes to clinic.  Pt last seen 10/24/2022.    Requested Prescriptions  Pending Prescriptions Disp Refills   albuterol  (VENTOLIN  HFA) 108 (90 Base) MCG/ACT inhaler [Pharmacy Med Name: ALBUTEROL  HFA INH (200 PUFFS) 8.5GM] 8.5 g 2    Sig: INHALE 2 PUFFS INTO THE LUNGS EVERY 6 HOURS AS NEEDED FOR WHEEZING OR SHORTNESS OF BREATH     Pulmonology:  Beta Agonists 2 Failed - 12/11/2023  5:28 PM      Failed - Last BP in normal range    BP Readings from Last 1 Encounters:  04/27/23 (!) 167/85         Failed - Valid encounter within last 12 months    Recent Outpatient Visits   None            Passed - Last Heart Rate in normal range    Pulse Readings from Last 1 Encounters:  04/27/23 80

## 2023-12-12 NOTE — Telephone Encounter (Signed)
 Called patient to get her scheduled for an appt

## 2023-12-15 NOTE — Telephone Encounter (Signed)
 Scheduled for 10/21

## 2024-01-14 ENCOUNTER — Other Ambulatory Visit: Payer: Self-pay | Admitting: Family Medicine

## 2024-01-14 ENCOUNTER — Other Ambulatory Visit: Payer: Self-pay

## 2024-01-14 ENCOUNTER — Encounter: Payer: Self-pay | Admitting: Pharmacist

## 2024-01-15 NOTE — Telephone Encounter (Signed)
 Requested medications are due for refill today.  yes  Requested medications are on the active medications list.  yes  Last refill. 12/15/2023 8.5 0 rf  Future visit scheduled.   yes  Notes to clinic.  Pt last office visit 10/15/2022. Please advise.    Requested Prescriptions  Pending Prescriptions Disp Refills   albuterol  (VENTOLIN  HFA) 108 (90 Base) MCG/ACT inhaler [Pharmacy Med Name: ALBUTEROL  HFA INH (200 PUFFS) 8.5GM] 8.5 g 0    Sig: INHALE 2 PUFFS INTO THE LUNGS EVERY 6 HOURS AS NEEDED FOR WHEEZING OR SHORTNESS OF BREATH     Pulmonology:  Beta Agonists 2 Failed - 01/15/2024  4:16 PM      Failed - Last BP in normal range    BP Readings from Last 1 Encounters:  04/27/23 (!) 167/85         Failed - Valid encounter within last 12 months    Recent Outpatient Visits   None            Passed - Last Heart Rate in normal range    Pulse Readings from Last 1 Encounters:  04/27/23 80

## 2024-01-20 ENCOUNTER — Ambulatory Visit: Admitting: Family Medicine

## 2024-01-27 NOTE — Progress Notes (Signed)
 Andrea Kane                                          MRN: [id number]   01/27/2024   The [facility] Quality Team Specialist reviewed this patient medical record for the purposes of chart review for care gap closure. The following were reviewed: chart review for care gap closure-breast cancer screening and colorectal cancer screening.    [facility] Quality Team

## 2024-02-12 ENCOUNTER — Ambulatory Visit: Admitting: Family Medicine

## 2024-02-12 ENCOUNTER — Encounter: Payer: Self-pay | Admitting: Family Medicine

## 2024-02-12 ENCOUNTER — Other Ambulatory Visit (HOSPITAL_COMMUNITY)
Admission: RE | Admit: 2024-02-12 | Discharge: 2024-02-12 | Disposition: A | Source: Ambulatory Visit | Attending: Family Medicine | Admitting: Family Medicine

## 2024-02-12 VITALS — BP 147/92 | HR 76 | Temp 97.9°F | Ht 62.0 in | Wt 157.4 lb

## 2024-02-12 DIAGNOSIS — Z23 Encounter for immunization: Secondary | ICD-10-CM | POA: Diagnosis not present

## 2024-02-12 DIAGNOSIS — Z0000 Encounter for general adult medical examination without abnormal findings: Secondary | ICD-10-CM

## 2024-02-12 DIAGNOSIS — F331 Major depressive disorder, recurrent, moderate: Secondary | ICD-10-CM

## 2024-02-12 DIAGNOSIS — J449 Chronic obstructive pulmonary disease, unspecified: Secondary | ICD-10-CM | POA: Diagnosis not present

## 2024-02-12 DIAGNOSIS — Z1231 Encounter for screening mammogram for malignant neoplasm of breast: Secondary | ICD-10-CM | POA: Diagnosis not present

## 2024-02-12 DIAGNOSIS — Z124 Encounter for screening for malignant neoplasm of cervix: Secondary | ICD-10-CM

## 2024-02-12 DIAGNOSIS — Z1211 Encounter for screening for malignant neoplasm of colon: Secondary | ICD-10-CM | POA: Diagnosis not present

## 2024-02-12 DIAGNOSIS — T7491XA Unspecified adult maltreatment, confirmed, initial encounter: Secondary | ICD-10-CM

## 2024-02-12 DIAGNOSIS — I10 Essential (primary) hypertension: Secondary | ICD-10-CM

## 2024-02-12 DIAGNOSIS — Z1151 Encounter for screening for human papillomavirus (HPV): Secondary | ICD-10-CM | POA: Diagnosis not present

## 2024-02-12 DIAGNOSIS — L03112 Cellulitis of left axilla: Secondary | ICD-10-CM

## 2024-02-12 DIAGNOSIS — Z136 Encounter for screening for cardiovascular disorders: Secondary | ICD-10-CM

## 2024-02-12 DIAGNOSIS — R202 Paresthesia of skin: Secondary | ICD-10-CM

## 2024-02-12 DIAGNOSIS — Z789 Other specified health status: Secondary | ICD-10-CM

## 2024-02-12 DIAGNOSIS — Z01419 Encounter for gynecological examination (general) (routine) without abnormal findings: Secondary | ICD-10-CM | POA: Insufficient documentation

## 2024-02-12 DIAGNOSIS — H538 Other visual disturbances: Secondary | ICD-10-CM

## 2024-02-12 LAB — MICROALBUMIN, URINE WAIVED
Creatinine, Urine Waived: 50 mg/dL (ref 10–300)
Microalb, Ur Waived: 30 mg/L — ABNORMAL HIGH (ref 0–19)

## 2024-02-12 LAB — BAYER DCA HB A1C WAIVED: HB A1C (BAYER DCA - WAIVED): 5.6 % (ref 4.8–5.6)

## 2024-02-12 MED ORDER — LISINOPRIL 10 MG PO TABS: 10.0000 mg | ORAL_TABLET | Freq: Every day | ORAL | 2 refills | Status: DC

## 2024-02-12 MED ORDER — ALBUTEROL SULFATE HFA 108 (90 BASE) MCG/ACT IN AERS: 2.0000 | INHALATION_SPRAY | Freq: Four times a day (QID) | RESPIRATORY_TRACT | 6 refills | Status: AC | PRN

## 2024-02-12 MED ORDER — TRELEGY ELLIPTA 100-62.5-25 MCG/ACT IN AEPB: 1.0000 | INHALATION_SPRAY | Freq: Every day | RESPIRATORY_TRACT | 12 refills | Status: AC
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
## 2024-02-13 LAB — COMPREHENSIVE METABOLIC PANEL WITH GFR
ALT: 27 IU/L (ref 0–32)
AST: 23 IU/L (ref 0–40)
Albumin: 4 g/dL (ref 3.8–4.9)
Alkaline Phosphatase: 108 IU/L (ref 49–135)
BUN/Creatinine Ratio: 18 (ref 9–23)
BUN: 14 mg/dL (ref 6–24)
Bilirubin Total: <0.2 mg/dL (ref 0.0–1.2)
CO2: 27 mmol/L (ref 20–29)
Calcium: 9.1 mg/dL (ref 8.7–10.2)
Chloride: 102 mmol/L (ref 96–106)
Creatinine, Ser: 0.78 mg/dL (ref 0.57–1.00)
Globulin, Total: 2.1 g/dL (ref 1.5–4.5)
Glucose: 104 mg/dL — ABNORMAL HIGH (ref 70–99)
Potassium: 3.7 mmol/L (ref 3.5–5.2)
Sodium: 143 mmol/L (ref 134–144)
Total Protein: 6.1 g/dL (ref 6.0–8.5)
eGFR: 92 mL/min/1.73 (ref >59)

## 2024-02-13 LAB — CBC WITH DIFFERENTIAL/PLATELET
Basophils Absolute: 0.1 x10E3/uL (ref 0.0–0.2)
Basos: 1 %
EOS (ABSOLUTE): 0.3 x10E3/uL (ref 0.0–0.4)
Eos: 4 %
Hematocrit: 40.6 % (ref 34.0–46.6)
Hemoglobin: 13.5 g/dL (ref 11.1–15.9)
Immature Grans (Abs): 0 x10E3/uL (ref 0.0–0.1)
Immature Granulocytes: 0 %
Lymphocytes Absolute: 2.4 x10E3/uL (ref 0.7–3.1)
Lymphs: 37 %
MCH: 29.7 pg (ref 26.6–33.0)
MCHC: 33.3 g/dL (ref 31.5–35.7)
MCV: 89 fL (ref 79–97)
Monocytes Absolute: 0.6 x10E3/uL (ref 0.1–0.9)
Monocytes: 8 %
Neutrophils Absolute: 3.2 x10E3/uL (ref 1.4–7.0)
Neutrophils: 50 %
Platelets: 415 x10E3/uL (ref 150–450)
RBC: 4.54 x10E6/uL (ref 3.77–5.28)
RDW: 12.4 % (ref 11.7–15.4)
WBC: 6.5 x10E3/uL (ref 3.4–10.8)

## 2024-02-13 LAB — HEPATITIS B SURFACE ANTIBODY, QUANTITATIVE: Hepatitis B Surf Ab Quant: <3.5 m[IU]/mL — ABNORMAL LOW

## 2024-02-13 LAB — LIPID PANEL W/O CHOL/HDL RATIO
Cholesterol, Total: 173 mg/dL (ref 100–199)
HDL: 60 mg/dL (ref >39)
LDL Chol Calc (NIH): 99 mg/dL (ref 0–99)
Triglycerides: 74 mg/dL (ref 0–149)
VLDL Cholesterol Cal: 14 mg/dL (ref 5–40)

## 2024-02-13 LAB — VITAMIN B12: Vitamin B-12: 551 pg/mL (ref 232–1245)

## 2024-02-13 LAB — TSH: TSH: 0.724 u[IU]/mL (ref 0.450–4.500)

## 2024-02-16 ENCOUNTER — Ambulatory Visit: Payer: Self-pay | Admitting: Family Medicine

## 2024-02-16 ENCOUNTER — Telehealth: Payer: Self-pay

## 2024-02-16 DIAGNOSIS — T7491XA Unspecified adult maltreatment, confirmed, initial encounter: Secondary | ICD-10-CM | POA: Insufficient documentation

## 2024-02-16 NOTE — Assessment & Plan Note (Signed)
 Not under good control. Has not been taking her medicine as prescribed. Rx resent in. Call with any concerns. Recheck in 1 month.

## 2024-02-16 NOTE — Assessment & Plan Note (Signed)
 Referral to [facility] placed today. Await their input.

## 2024-02-16 NOTE — Progress Notes (Signed)
 Complex Care Management Note Care Guide Note  02/16/2024 Name: Andrea Kane MRN: [id number] DOB: Aug 06, 1970   Complex Care Management Outreach Attempts: An unsuccessful telephone outreach was attempted today to offer the patient information about available complex care management services.  Follow Up Plan:  Additional outreach attempts will be made to offer the patient complex care management information and services.   Encounter Outcome:  No Answer  Jeoffrey Buffalo , RMA     [place]  [facility], [facility] Guide  Direct Dial: [phone]  Website: [email or web address]

## 2024-02-16 NOTE — Progress Notes (Signed)
 Chief Complaint  Patient presents with   Annual Exam   COPD   Hypertension     Subjective:   Andrea Kane is a 53 y.o. female who presents for a Medicare Annual Wellness Visit.  Allergies (verified) Patient has no known allergies.   History: Past Medical History:  Diagnosis Date   Anxiety    COPD (chronic obstructive pulmonary disease) (HCC)    Ectopic pregnancy    Past Surgical History:  Procedure Laterality Date   BACK SURGERY     X3   OOPHORECTOMY Left    Family History  Problem Relation Age of Onset   Diabetes Mother    Hypertension Father    COPD Sister    Drug abuse Sister    Cerebral palsy Son    Alzheimer's disease Maternal Grandmother    Brain cancer Maternal Grandmother    Hypertension Paternal Grandfather    Hyperlipidemia Paternal Grandfather    Cancer Other 16       cervical   Breast cancer Maternal Aunt 38   Stomach cancer Maternal Aunt    Social History   Occupational History   Occupation: disability   Tobacco Use   Smoking status: Former    Types: Cigars   Smokeless tobacco: Never   Tobacco comments:    over 6-7 years ago   Vaping Use   Vaping status: Former   Substances: CBD, Mixture of cannabinoids  Substance and Sexual Activity   Alcohol use: No   Drug use: Not Currently   Sexual activity: Not Currently    Birth control/protection: None   Tobacco Counseling Counseling given: Not Answered Tobacco comments: over 6-7 years ago   SDOH Screenings   Food Insecurity: No Food Insecurity (08/13/2022)  Housing: High Risk (08/13/2022)  Transportation Needs: No Transportation Needs (02/12/2024)  Utilities: At Risk (02/12/2024)  Alcohol Screen: Low Risk  (02/12/2024)  Depression (PHQ2-9): High Risk (02/12/2024)  Financial Resource Strain: Medium Risk (02/12/2024)  Physical Activity: Insufficiently Active (02/12/2024)  Social Connections: Socially Integrated (02/12/2024)  Stress: Stress Concern Present (02/12/2024)  Tobacco Use:  Medium Risk (02/12/2024)  Health Literacy: Adequate Health Literacy (02/12/2024)   See flowsheets for full screening details  Depression Screen PHQ 2 & 9 Depression Scale- Over the past 2 weeks, how often have you been bothered by any of the following problems? Little interest or pleasure in doing things: 1 Feeling down, depressed, or hopeless (PHQ Adolescent also includes...irritable): 2 PHQ-2 Total Score: 3 Trouble falling or staying asleep, or sleeping too much: 2 Feeling tired or having little energy: 2 Poor appetite or overeating (PHQ Adolescent also includes...weight loss): 3 Feeling bad about yourself - or that you are a failure or have let yourself or your family down: 3 Trouble concentrating on things, such as reading the newspaper or watching television (PHQ Adolescent also includes...like school work): 3 Moving or speaking so slowly that other people could have noticed. Or the opposite - being so fidgety or restless that you have been moving around a lot more than usual: 2 Thoughts that you would be better off dead, or of hurting yourself in some way: 0 PHQ-9 Total Score: 18 If you checked off any problems, how difficult have these problems made it for you to do your work, take care of things at home, or get along with other people?: Somewhat difficult  Depression Treatment Depression Interventions/Treatment : Patient refuses Treatment     Goals Addressed   None    Visit info / Clinical  Intake: Medicare Wellness Visit Type:: Subsequent Annual Wellness Visit Persons participating in visit:: patient Medicare Wellness Visit Mode:: In-person (required for WTM) Information given by:: patient Interpreter Needed?: No Pre-visit prep was completed: no AWV questionnaire completed by patient prior to visit?: no Living arrangements:: lives with spouse/significant other Patient's Overall Health Status Rating: good Typical amount of pain: none Does pain affect daily life?: no Are  you currently prescribed opioids?: no  Dietary Habits and Nutritional Risks How many meals a day?: 2 Eats fruit and vegetables daily?: yes Most meals are obtained by: preparing own meals In the last 2 weeks, have you had any of the following?: none Diabetic:: no  Functional Status Activities of Daily Living (to include ambulation/medication): Independent Ambulation: Independent Medication Administration: Independent Home Management: Independent Manage your own finances?: yes Primary transportation is: driving Concerns about vision?: (!) yes (Wears glasses) Concerns about hearing?: no  Fall Screening Falls in the past year?: 0 Number of falls in past year: 0 Was there an injury with Fall?: 0 Fall Risk Category Calculator: 0 Patient Fall Risk Level: Low Fall Risk  Fall Risk Patient at Risk for Falls Due to: No Fall Risks Fall risk Follow up: Falls evaluation completed  Home and Transportation Safety: All rugs have non-skid backing?: yes All stairs or steps have railings?: N/A, no stairs Grab bars in the bathtub or shower?: (!) no Have non-skid surface in bathtub or shower?: (!) no Good home lighting?: yes Regular seat belt use?: yes Hospital stays in the last year:: no  Cognitive Assessment Difficulty concentrating, remembering, or making decisions? : yes (Concerns of remebering) Will 6CIT or Mini Cog be Completed: yes What year is it?: 0 points What month is it?: 0 points Give patient an address phrase to remember (5 components): 2011 [address] [place], [place] About what time is it?: 0 points Count backwards from 20 to 1: 0 points Say the months of the year in reverse: 0 points Repeat the address phrase from earlier: 0 points 6 CIT Score: 0 points  Advance Directives (For Healthcare) Does Patient Have a Medical Advance Directive?: No Would patient like information on creating a medical advance directive?: No - Patient declined  Reviewed/Updated   Reviewed/Updated: Reviewed All (Medical, Surgical, Family, Medications, Allergies, Care Teams, Patient Goals)        Objective:    Today's Vitals   02/12/24 1438 02/12/24 1455 02/12/24 1456  BP: (!) 167/92 (!) 147/92   Pulse: 81 76   Temp: 97.9 F (36.6 C)    TempSrc: Oral    SpO2: 97%    Weight: 157 lb 6.4 oz (71.4 kg)    Height: 5' 2 (1.575 m)    PainSc: 0-No pain  0-No pain   Body mass index is 28.79 kg/m.  Current Medications (verified) Outpatient Encounter Medications as of 02/12/2024  Medication Sig   lisinopril  (ZESTRIL ) 10 MG tablet Take 1 tablet (10 mg total) by mouth daily.   [DISCONTINUED] albuterol  (VENTOLIN  HFA) 108 (90 Base) MCG/ACT inhaler INHALE 2 PUFFS INTO THE LUNGS EVERY 6 HOURS AS NEEDED FOR WHEEZING OR SHORTNESS OF BREATH   [DISCONTINUED] traZODone (DESYREL) 50 MG tablet Take 50 mg by mouth at bedtime.   [DISCONTINUED] TRELEGY ELLIPTA  100-62.5-25 MCG/ACT AEPB INHALE 1 PUFF INTO THE LUNGS DAILY   albuterol  (VENTOLIN  HFA) 108 (90 Base) MCG/ACT inhaler Inhale 2 puffs into the lungs every 6 (six) hours as needed for wheezing or shortness of breath.   Fluticasone-Umeclidin-Vilant (TRELEGY ELLIPTA ) 100-62.5-25 MCG/ACT AEPB Inhale  1 puff into the lungs daily.   [DISCONTINUED] amoxicillin  (AMOXIL ) 500 MG capsule Take 1 capsule (500 mg total) by mouth 3 (three) times daily.   [DISCONTINUED] citalopram  (CELEXA ) 10 MG tablet Take 1 tablet (10 mg total) by mouth daily. (Patient not taking: Reported on 02/12/2024)   No facility-administered encounter medications on file as of 02/12/2024.   Hearing/Vision screen No results found. Immunizations and Health Maintenance Health Maintenance  Topic Date Due   Hepatitis B Vaccines 19-59 Average Risk (1 of 3 - 19+ 3-dose series) Never done   Mammogram  Never done   Pneumococcal Vaccine: 50+ Years (2 of 2 - PCV) 03/01/2017   Colonoscopy  Never done   Influenza Vaccine  06/29/2024 (Originally 10/31/2023)   Zoster Vaccines-  Shingrix (2 of 2) 04/08/2024   Medicare Annual Wellness (AWV)  02/11/2025   Cervical Cancer Screening (HPV/Pap Cotest)  03/15/2027   DTaP/Tdap/Td (2 - Td or Tdap) 07/05/2027   Hepatitis C Screening  Completed   HIV Screening  Completed   HPV VACCINES  Aged Out   Meningococcal B Vaccine  Aged Out   COVID-19 Vaccine  Discontinued        Assessment/Plan:  This is a routine wellness examination for Andrea Kane.  Patient Care Team: Vicci Duwaine SQUIBB, DO as PCP - General (Family Medicine) Scheutzow, Oneil DEL, DO as Referring Physician [facility]) Merlynn Lyle CROME, LCSW as Social Worker (Licensed Visual Merchandiser)  I have personally reviewed and noted the following in the patient's chart:   Medical and social history Use of alcohol, tobacco or illicit drugs  Current medications and supplements including opioid prescriptions. Functional ability and status Nutritional status Physical activity Advanced directives List of other physicians Hospitalizations, surgeries, and ER visits in previous 12 months Vitals Screenings to include cognitive, depression, and falls Referrals and appointments  Orders Placed This Encounter  Procedures   MM 3D SCREENING MAMMOGRAM BILATERAL BREAST    Standing Status:   Future    Expiration Date:   02/11/2025    Reason for Exam (SYMPTOM  OR DIAGNOSIS REQUIRED):   Screening for breast cancer    Preferred imaging location?:   [facility]    Is the patient pregnant?:   No   Pneumococcal conjugate vaccine 20-valent (Prevnar 20)   Zoster Recombinant (Shingrix )   CBC with Differential/Platelet   Comprehensive metabolic panel with GFR    Has the patient fasted?:   Yes   Lipid Panel w/o Chol/HDL Ratio    Has the patient fasted?:   Yes   TSH   Microalbumin, Urine Waived   Hepatitis B surface antibody,quantitative   Bayer DCA Hb A1c Waived   B12   Cologuard   AMB Referral [facility] Care Management    Referral Priority:   Routine    Referral Type:    Consultation    Referral Reason:   Care Coordination    Number of Visits Requested:   1   In addition, I have reviewed and discussed with patient certain preventive protocols, quality metrics, and best practice recommendations. A written personalized care plan for preventive services as well as general preventive health recommendations were provided to patient.   Duwaine Vicci, DO   02/16/2024   Return in about 4 weeks (around 03/11/2024).  After Visit Summary: (In Person-Printed) AVS printed and given to the patient

## 2024-02-16 NOTE — Assessment & Plan Note (Signed)
 Not under good control. Will restart her lisinopril  and recheck in 1 month. Labs drawn today. Call with any concerns.

## 2024-02-16 NOTE — Patient Instructions (Signed)
 Preventative Services:  Health Risk Assessment and Personalized Prevention Plan: done today Bone Mass Measurements: N/A Breast Cancer Screening: Ordered today CVD Screening: done today Cervical Cancer Screening: done today Colon Cancer Screening: ordered today Depression Screening: done today Diabetes Screening: done today Glaucoma Screening: see your eye doctor Hepatitis B vaccine: titer checked today Hepatitis C screening: Up to date HIV Screening: up to date Flu Vaccine: declined Lung cancer Screening: N/A Obesity Screening: done today Pneumonia Vaccines: will get next visit- out of stock STI Screening: N/A  Andrea Kane,  Thank you for taking the time for your Medicare Wellness Visit. I appreciate your continued commitment to your health goals. Please review the care plan we discussed, and feel free to reach out if I can assist you further.  Please note that Annual Wellness Visits do not include a physical exam. Some assessments may be limited, especially if the visit was conducted virtually. If needed, we may recommend an in-person follow-up with your provider.  Ongoing Care Seeing your primary care provider every 3 to 6 months helps us  monitor your health and provide consistent, personalized care.   Referrals If a referral was made during today's visit and you haven't received any updates within two weeks, please contact the referred provider directly to check on the status.  Recommended Screenings:  Health Maintenance  Topic Date Due   Hepatitis B Vaccine (1 of 3 - 19+ 3-dose series) Never done   Breast Cancer Screening  Never done   Pneumococcal Vaccine for age over 11 (2 of 2 - PCV) 03/01/2017   Colon Cancer Screening  Never done   Medicare Annual Wellness Visit  03/15/2023   Flu Shot  06/29/2024*   Zoster (Shingles) Vaccine (2 of 2) 04/08/2024   Pap with HPV screening  03/15/2027   DTaP/Tdap/Td vaccine (2 - Td or Tdap) 07/05/2027   Hepatitis C Screening  Completed    HIV Screening  Completed   HPV Vaccine  Aged Out   Meningitis B Vaccine  Aged Out   COVID-19 Vaccine  Discontinued  *Topic was postponed. The date shown is not the original due date.       02/12/2024    2:48 PM  Advanced Directives  Does Patient Have a Medical Advance Directive? No    Vision: Annual vision screenings are recommended for early detection of glaucoma, cataracts, and diabetic retinopathy. These exams can also reveal signs of chronic conditions such as diabetes and high blood pressure.  Dental: Annual dental screenings help detect early signs of oral cancer, gum disease, and other conditions linked to overall health, including heart disease and diabetes.  Please see the attached documents for additional preventive care recommendations.

## 2024-02-16 NOTE — Assessment & Plan Note (Signed)
 Declines medication. States that she's doing well despite PHQ9. Continue to monitor. Call with any concerns.

## 2024-02-19 LAB — CYTOLOGY - PAP
Adequacy: ABSENT
Comment: NEGATIVE
Diagnosis: UNDETERMINED — AB
High risk HPV: NEGATIVE

## 2024-02-20 NOTE — Progress Notes (Signed)
 Complex Care Management Note  Care Guide Note 02/20/2024 Name: Andrea Kane MRN: [id number] DOB: 06/29/1970  Andrea Kane is a 53 y.o. year old female who sees Vicci Duwaine SQUIBB, DO for primary care. I reached out to Andrea Kane by phone today to offer complex care management services.  [name] was given information about Complex Care Management services today including:   The Complex Care Management services include support from the care team which includes your Nurse Care Manager, Clinical Social Worker, or Pharmacist.  The Complex Care Management team is here to help remove barriers to the health concerns and goals most important to you. Complex Care Management services are voluntary, and the patient may decline or stop services at any time by request to their care team member.   Complex Care Management Consent Status: Patient agreed to services and verbal consent obtained.   Follow up plan:  Telephone appointment with complex care management team member scheduled for:  03/08/2024  Encounter Outcome:  Patient Scheduled  Jeoffrey Buffalo , RMA     [place]  [facility] Guide  Direct Dial: [phone]  Website: [email or web address]

## 2024-03-01 ENCOUNTER — Inpatient Hospital Stay: Admission: RE | Admit: 2024-03-01 | Source: Ambulatory Visit

## 2024-03-08 ENCOUNTER — Telehealth: Payer: Self-pay | Admitting: Licensed Clinical Social Worker

## 2024-03-08 ENCOUNTER — Telehealth: Payer: Self-pay | Admitting: Family Medicine

## 2024-03-08 ENCOUNTER — Encounter: Payer: Self-pay | Admitting: Licensed Clinical Social Worker

## 2024-03-08 NOTE — Telephone Encounter (Unsigned)
 Copied from CRM #[id number]. Topic: Appointments - Scheduling Inquiry for Clinic >> Mar 08, 2024  1:46 PM Myrick T wrote: Reason for CRM: patient called said she missed her appt today at 1pm. Please call to reschedule

## 2024-03-15 ENCOUNTER — Encounter: Payer: Self-pay | Admitting: Family Medicine

## 2024-03-15 ENCOUNTER — Ambulatory Visit: Admitting: Family Medicine

## 2024-03-15 VITALS — BP 136/86 | HR 83 | Temp 98.0°F | Ht 62.0 in | Wt 162.4 lb

## 2024-03-15 DIAGNOSIS — J449 Chronic obstructive pulmonary disease, unspecified: Secondary | ICD-10-CM

## 2024-03-15 DIAGNOSIS — I10 Essential (primary) hypertension: Secondary | ICD-10-CM | POA: Diagnosis not present

## 2024-03-15 DIAGNOSIS — Z789 Other specified health status: Secondary | ICD-10-CM | POA: Diagnosis not present

## 2024-03-15 DIAGNOSIS — Z23 Encounter for immunization: Secondary | ICD-10-CM | POA: Diagnosis not present

## 2024-03-15 DIAGNOSIS — L02811 Cutaneous abscess of head [any part, except face]: Secondary | ICD-10-CM

## 2024-03-15 MED ORDER — LISINOPRIL 10 MG PO TABS: 10.0000 mg | ORAL_TABLET | Freq: Every day | ORAL | 1 refills | Status: AC

## 2024-03-15 MED ORDER — SULFAMETHOXAZOLE-TRIMETHOPRIM 800-160 MG PO TABS: 1.0000 | ORAL_TABLET | Freq: Two times a day (BID) | ORAL | 0 refills | Status: DC

## 2024-03-15 NOTE — Assessment & Plan Note (Signed)
 Under good control on current regimen. Continue current regimen. Continue to monitor. Call with any concerns. Refills given. Labs drawn today.

## 2024-03-15 NOTE — Progress Notes (Signed)
 BP 136/86   Pulse 83   Temp 98 F (36.7 C) (Oral)   Ht 5' 2 (1.575 m)   Wt 162 lb 6.4 oz (73.7 kg)   SpO2 97%   BMI 29.70 kg/m    Subjective:    Patient ID: Andrea Kane, female    DOB: [birth year], 53 y.o.   MRN: [id number]  HPI: Andrea Kane is a 53 y.o. female  Chief Complaint  Patient presents with   Hypertension   COPD   Neck Pain   HYPERTENSION  Hypertension status: better  Satisfied with current treatment? yes Duration of hypertension: chronic BP monitoring frequency:  rarely BP medication side effects:  no Medication compliance: excellent compliance Previous BP meds: lisinopril  Aspirin: no Recurrent headaches: no Visual changes: no Palpitations: no Dyspnea: no Chest pain: no Lower extremity edema: no Dizzy/lightheaded: no  COPD COPD status: controlled Satisfied with current treatment?: yes Oxygen use: no Dyspnea frequency: occasionally Cough frequency: occasionally Rescue inhaler frequency:  occasionally Limitation of activity: no Productive cough: no Pneumovax: Up to Date Influenza: Up to Date  SKIN INFECTION Duration: few days Location: back of head on the L History of trauma in area: yes Pain: yes Quality: aching and sore Severity: moderate Redness: yes Swelling: yes Oozing: yes Pus: yes Fevers: no Nausea/vomiting: no Status: worse Treatments attempted:warm compresses  Tetanus: UTD  Relevant past medical, surgical, family and social history reviewed and updated as indicated. Interim medical history since our last visit reviewed. Allergies and medications reviewed and updated.  Review of Systems  Constitutional: Negative.   Respiratory: Negative.    Cardiovascular: Negative.   Musculoskeletal: Negative.   Skin:  Positive for wound. Negative for color change, pallor and rash.  Neurological: Negative.   Psychiatric/Behavioral: Negative.      Per HPI unless specifically indicated above     Objective:    BP 136/86    Pulse 83   Temp 98 F (36.7 C) (Oral)   Ht 5' 2 (1.575 m)   Wt 162 lb 6.4 oz (73.7 kg)   SpO2 97%   BMI 29.70 kg/m   Wt Readings from Last 3 Encounters:  03/15/24 162 lb 6.4 oz (73.7 kg)  02/12/24 157 lb 6.4 oz (71.4 kg)  04/27/23 153 lb (69.4 kg)    Physical Exam Vitals and nursing note reviewed.  Constitutional:      General: She is not in acute distress.    Appearance: Normal appearance. She is not ill-appearing, toxic-appearing or diaphoretic.  HENT:     Head: Normocephalic and atraumatic.     Right Ear: External ear normal.     Left Ear: External ear normal.     Nose: Nose normal.     Mouth/Throat:     Mouth: Mucous membranes are moist.     Pharynx: Oropharynx is clear.  Eyes:     General: No scleral icterus.       Right eye: No discharge.        Left eye: No discharge.     Extraocular Movements: Extraocular movements intact.     Conjunctiva/sclera: Conjunctivae normal.     Pupils: Pupils are equal, round, and reactive to light.  Cardiovascular:     Rate and Rhythm: Normal rate and regular rhythm.     Pulses: Normal pulses.     Heart sounds: Normal heart sounds. No murmur heard.    No friction rub. No gallop.  Pulmonary:     Effort: Pulmonary effort is normal. No respiratory  distress.     Breath sounds: Normal breath sounds. No stridor. No wheezing, rhonchi or rales.  Chest:     Chest wall: No tenderness.  Musculoskeletal:        General: Normal range of motion.     Cervical back: Normal range of motion and neck supple.  Skin:    General: Skin is warm and dry.     Capillary Refill: Capillary refill takes less than 2 seconds.     Coloration: Skin is not jaundiced or pale.     Findings: No bruising, erythema, lesion or rash.     Comments: Swelling, oozing abscess in back on L side of her head, about 2 inches  Neurological:     General: No focal deficit present.     Mental Status: She is alert and oriented to person, place, and time. Mental status is at  baseline.  Psychiatric:        Mood and Affect: Mood normal.        Behavior: Behavior normal.        Thought Content: Thought content normal.        Judgment: Judgment normal.     Results for orders placed or performed in visit on 02/12/24  Microalbumin, Urine Waived   Collection Time: 02/12/24  3:14 PM  Result Value Ref Range   Microalb, Ur Waived 30 (H) 0 - 19 mg/L   Creatinine, Urine Waived 50 10 - 300 mg/dL   Microalb/Creat Ratio 30-300 (H) <30 mg/g  Bayer DCA Hb A1c Waived   Collection Time: 02/12/24  3:14 PM  Result Value Ref Range   HB A1C (BAYER DCA - WAIVED) 5.6 4.8 - 5.6 %  Cytology - PAP   Collection Time: 02/12/24  3:14 PM  Result Value Ref Range   High risk HPV Negative    Adequacy      Satisfactory for evaluation; transformation zone component ABSENT.   Diagnosis (A)     - Atypical squamous cells of undetermined significance (ASC-US )   Comment Normal Reference Range HPV - Negative   CBC with Differential/Platelet   Collection Time: 02/12/24  3:15 PM  Result Value Ref Range   WBC 6.5 3.4 - 10.8 x10E3/uL   RBC 4.54 3.77 - 5.28 x10E6/uL   Hemoglobin 13.5 11.1 - 15.9 g/dL   Hematocrit 59.3 65.9 - 46.6 %   MCV 89 79 - 97 fL   MCH 29.7 26.6 - 33.0 pg   MCHC 33.3 31.5 - 35.7 g/dL   RDW 87.5 88.2 - 84.5 %   Platelets 415 150 - 450 x10E3/uL   Neutrophils 50 Not Estab. %   Lymphs 37 Not Estab. %   Monocytes 8 Not Estab. %   Eos 4 Not Estab. %   Basos 1 Not Estab. %   Neutrophils Absolute 3.2 1.4 - 7.0 x10E3/uL   Lymphocytes Absolute 2.4 0.7 - 3.1 x10E3/uL   Monocytes Absolute 0.6 0.1 - 0.9 x10E3/uL   EOS (ABSOLUTE) 0.3 0.0 - 0.4 x10E3/uL   Basophils Absolute 0.1 0.0 - 0.2 x10E3/uL   Immature Granulocytes 0 Not Estab. %   Immature Grans (Abs) 0.0 0.0 - 0.1 x10E3/uL  Comprehensive metabolic panel with GFR   Collection Time: 02/12/24  3:15 PM  Result Value Ref Range   Glucose 104 (H) 70 - 99 mg/dL   BUN 14 6 - 24 mg/dL   Creatinine, Ser 9.21 0.57 - 1.00  mg/dL   eGFR 92 >40 fO/fpw/8.26   BUN/Creatinine Ratio 18 9 -  23   Sodium 143 134 - 144 mmol/L   Potassium 3.7 3.5 - 5.2 mmol/L   Chloride 102 96 - 106 mmol/L   CO2 27 20 - 29 mmol/L   Calcium 9.1 8.7 - 10.2 mg/dL   Total Protein 6.1 6.0 - 8.5 g/dL   Albumin 4.0 3.8 - 4.9 g/dL   Globulin, Total 2.1 1.5 - 4.5 g/dL   Bilirubin Total <9.7 0.0 - 1.2 mg/dL   Alkaline Phosphatase 108 49 - 135 IU/L   AST 23 0 - 40 IU/L   ALT 27 0 - 32 IU/L  Lipid Panel w/o Chol/HDL Ratio   Collection Time: 02/12/24  3:15 PM  Result Value Ref Range   Cholesterol, Total 173 100 - 199 mg/dL   Triglycerides 74 0 - 149 mg/dL   HDL 60 >60 mg/dL   VLDL Cholesterol Cal 14 5 - 40 mg/dL   LDL Chol Calc (NIH) 99 0 - 99 mg/dL  TSH   Collection Time: 02/12/24  3:15 PM  Result Value Ref Range   TSH 0.724 0.450 - 4.500 uIU/mL  Hepatitis B surface antibody,quantitative   Collection Time: 02/12/24  3:15 PM  Result Value Ref Range   Hepatitis B Surf Ab Quant <3.5 (L) Immunity>10 mIU/mL  B12   Collection Time: 02/12/24  3:15 PM  Result Value Ref Range   Vitamin B-12 551 232 - 1,245 pg/mL      Assessment & Plan:   Problem List Items Addressed This Visit       Cardiovascular and Mediastinum   Primary hypertension   Under good control on current regimen. Continue current regimen. Continue to monitor. Call with any concerns. Refills given. Labs drawn today.        Relevant Medications   lisinopril  (ZESTRIL ) 10 MG tablet   Other Relevant Orders   Basic metabolic panel with GFR     Respiratory   COPD (chronic obstructive pulmonary disease) (HCC)   Under good control on current regimen. Continue current regimen. Continue to monitor. Call with any concerns. Refills given. Labs drawn today.        Other Visit Diagnoses       Scalp abscess    -  Primary   Given location will get her into general surgery ASAP. Start bactrim . Await their input.   Relevant Orders   Ambulatory referral to General Surgery      Not immune to hepatitis B virus       Hep B shot given today.        Follow up plan: Return in about 6 months (around 09/13/2024) for 1 month nurse only visit for Hep B #2.

## 2024-03-16 ENCOUNTER — Ambulatory Visit: Payer: Self-pay | Admitting: Family Medicine

## 2024-03-16 ENCOUNTER — Ambulatory Visit: Payer: Self-pay | Admitting: General Surgery

## 2024-03-16 LAB — BASIC METABOLIC PANEL WITH GFR
BUN/Creatinine Ratio: 18 (ref 9–23)
BUN: 11 mg/dL (ref 6–24)
CO2: 24 mmol/L (ref 20–29)
Calcium: 9.2 mg/dL (ref 8.7–10.2)
Chloride: 100 mmol/L (ref 96–106)
Creatinine, Ser: 0.62 mg/dL (ref 0.57–1.00)
Glucose: 81 mg/dL (ref 70–99)
Potassium: 4.3 mmol/L (ref 3.5–5.2)
Sodium: 138 mmol/L (ref 134–144)
eGFR: 108 mL/min/1.73 (ref >59)

## 2024-03-31 ENCOUNTER — Other Ambulatory Visit: Payer: Self-pay | Admitting: Licensed Clinical Social Worker

## 2024-03-31 NOTE — Patient Instructions (Signed)
 Visit Information  Thank you for taking time to visit with me today. Please don't hesitate to contact me if I can be of assistance to you before our next scheduled appointment.  Our next appointment is by telephone on 04/28/24 at 2pm. Please call the care guide team at [phone] if you need to cancel or reschedule your appointment.   Following is a copy of your care plan:   Goals Addressed   None     Please call 911 if you are experiencing a Mental Health or Behavioral Health Crisis or need someone to talk to.  Patient verbalized understanding of Care plan and visit instructions communicated this visit  Andrea Ligas, LCSW Clinical Social Worker [facility] [employer]

## 2024-03-31 NOTE — Patient Outreach (Signed)
 LCSW called patient on the phone. LCSW introduced self and explained reason for the call. Patient reports that her partner gets angry sometimes but is not violent. Patient reports she has plans on moving. Patient reports she does not need resources at this time. Patient reports she is safe. Patient requested LCSW call her again in about a month.   Cena Ligas, LCSW Clinical Social Worker [facility]

## 2024-04-02 ENCOUNTER — Other Ambulatory Visit: Payer: Self-pay

## 2024-04-02 ENCOUNTER — Emergency Department: Admission: EM | Admit: 2024-04-02 | Discharge: 2024-04-02 | Disposition: A | Discharge status: Home / Self Care

## 2024-04-02 ENCOUNTER — Emergency Department

## 2024-04-02 DIAGNOSIS — L02811 Cutaneous abscess of head [any part, except face]: Secondary | ICD-10-CM | POA: Insufficient documentation

## 2024-04-02 DIAGNOSIS — I10 Essential (primary) hypertension: Secondary | ICD-10-CM | POA: Insufficient documentation

## 2024-04-02 DIAGNOSIS — R079 Chest pain, unspecified: Secondary | ICD-10-CM | POA: Diagnosis present

## 2024-04-02 DIAGNOSIS — J449 Chronic obstructive pulmonary disease, unspecified: Secondary | ICD-10-CM | POA: Insufficient documentation

## 2024-04-02 LAB — BASIC METABOLIC PANEL WITH GFR
Anion gap: 10 (ref 5–15)
BUN: 9 mg/dL (ref 6–20)
CO2: 25 mmol/L (ref 22–32)
Calcium: 8.9 mg/dL (ref 8.9–10.3)
Chloride: 103 mmol/L (ref 98–111)
Creatinine, Ser: 0.55 mg/dL (ref 0.44–1.00)
GFR, Estimated: >60 mL/min
Glucose, Bld: 129 mg/dL — ABNORMAL HIGH (ref 70–99)
Potassium: 3.8 mmol/L (ref 3.5–5.1)
Sodium: 138 mmol/L (ref 135–145)

## 2024-04-02 LAB — CBC
HCT: 37.9 % (ref 36.0–46.0)
Hemoglobin: 12.6 g/dL (ref 12.0–15.0)
MCH: 29.7 pg (ref 26.0–34.0)
MCHC: 33.2 g/dL (ref 30.0–36.0)
MCV: 89.4 fL (ref 80.0–100.0)
Platelets: 546 K/uL — ABNORMAL HIGH (ref 150–400)
RBC: 4.24 MIL/uL (ref 3.87–5.11)
RDW: 13.5 % (ref 11.5–15.5)
WBC: 12.5 K/uL — ABNORMAL HIGH (ref 4.0–10.5)
nRBC: 0 % (ref 0.0–0.2)

## 2024-04-02 LAB — TROPONIN T, HIGH SENSITIVITY: Troponin T High Sensitivity: <15 ng/L (ref 0–19)

## 2024-04-02 MED ORDER — SULFAMETHOXAZOLE-TRIMETHOPRIM 800-160 MG PO TABS: 1.0000 | ORAL_TABLET | Freq: Two times a day (BID) | ORAL | 0 refills | Status: AC

## 2024-04-02 NOTE — ED Provider Notes (Signed)
 "  [facility] Provider Note    Event Date/Time   First MD Initiated Contact with Patient 04/02/24 1548     (approximate)   History   Abscess and Chest Pain   HPI  Andrea Kane is a 54 y.o. female with PMH of COPD, anxiety, depression and hypertension who presents for evaluation of an abscess to the back of her neck as well as chest pain.  Patient reports history of COPD and states she thought her chest pain was related to this.  She was around some people that were smoking and this tends to aggravate her COPD.  She describes it as sharp twinges of pain that have been occurring occasionally throughout the past week.  Patient's main concern is an abscess to the back of her neck.  She was seen by her primary care provider on 12/15 and was recommended to follow-up with general surgery to have it drained due to it being on her scalp.  Patient states that she overslept and missed her appointment on 12/16.  She reports that the abscess opened and drained quite a bit of fluid for a few days but has now stopped.  There is another smaller spot where the same thing occurred.  She reports worsening pain to the area.  She states she was on antibiotics which she took all of.      Physical Exam   Triage Vital Signs: ED Triage Vitals  Encounter Vitals Group     BP 04/02/24 1433 132/81     Girls Systolic BP Percentile --      Girls Diastolic BP Percentile --      Boys Systolic BP Percentile --      Boys Diastolic BP Percentile --      Pulse Rate 04/02/24 1433 91     Resp 04/02/24 1433 19     Temp 04/02/24 1433 97.8 F (36.6 C)     Temp Source 04/02/24 1433 Oral     SpO2 04/02/24 1433 97 %     Weight 04/02/24 1434 160 lb (72.6 kg)     Height 04/02/24 1434 5' 1 (1.549 m)     Head Circumference --      Peak Flow --      Pain Score 04/02/24 1439 8     Pain Loc --      Pain Education --      Exclude from Growth Chart --     Most recent vital signs: Vitals:    04/02/24 1433  BP: 132/81  Pulse: 91  Resp: 19  Temp: 97.8 F (36.6 C)  SpO2: 97%   General: Awake, no distress.  CV:  Good peripheral perfusion.  Resp:  Normal effort.  Abd:  No distention.  Other:  Area of swelling approximately 3 or 4 cm in diameter to the left upper neck and occiput with a scab, tender to palpation no fluctuance noted, smaller scab with minimal surrounding swelling and no fluctuance noted to the middle upper neck just within the hairline   ED Results / Procedures / Treatments   Labs (all labs ordered are listed, but only abnormal results are displayed) Labs Reviewed  BASIC METABOLIC PANEL WITH GFR - Abnormal; Notable for the following components:      Result Value   Glucose, Bld 129 (*)    All other components within normal limits  CBC - Abnormal; Notable for the following components:   WBC 12.5 (*)    Platelets 546 (*)  All other components within normal limits  TROPONIN T, HIGH SENSITIVITY     EKG  ED provider interpretation: Normal sinus rhythm without ST changes, normal axis.  Vent. rate 83 BPM  PR interval 136 ms  QRS duration 74 ms  QT/QTcB 358/420 ms  P-R-T axes 80 77 72   RADIOLOGY  Chest x-ray obtained, I interpreted the images as well as reviewed the radiologist report, which was negative for any acute cardiopulmonary abnormalities.  PROCEDURES:  Critical Care performed: No  Procedures   MEDICATIONS ORDERED IN ED: Medications - No data to display   IMPRESSION / MDM / ASSESSMENT AND PLAN / ED COURSE  I reviewed the triage vital signs and the nursing notes.                              Differential diagnosis includes, but is not limited to, abscess, cellulitis, folliculitis, ACS, PE, pneumonia, pneumothorax, COPD exacerbation, other reactive airway disease, GERD, musculoskeletal pain, anxiety.  Patient's presentation is most consistent with acute complicated illness / injury requiring diagnostic workup.  Given patient's  concern for chest pain will obtain CBC, BMP, troponin, chest x-ray and EKG.   On physical exam, patient's area of concern does not feel fluctuant but is indurated and tender. Patient's abscess was evaluated at bedside with an ultrasound by my attending physician, no fluid pocket was identified. Will place patient back on antibiotics. She has a follow up appointment with surgery on 1/6.   Cardiac workup is overall reassuring. Patient states that her chest pain was sharp and intermittent. It is not worse with exertion, no pain with deep breaths. I have a low suspicion for PE. Because patient is over 50, I cannot use PERC rule to rule out PE, based on wells criteria, PE is unlikely. Patient attributes her chest pain to stress.  Given normal troponin and EKG can rule out ACS as a cause.  Chest x-ray was negative for pneumonia pneumothorax.  Symptoms do not seem consistent with GERD or musculoskeletal pain.  I believe patient symptoms are attributed to a possible COPD exacerbation due to smoke exposure and then anxiety surrounding this.  I advised patient to follow-up with her primary care provider as needed.  She was instructed to continue using her inhalers for her COPD.  Patient voiced understanding, questions were answered and she is stable at discharge.  Clinical Course as of 04/02/24 1638  Fri Apr 02, 2024  1600 DG Chest 2 View Negative. [LD]  1616 Patient evaluated in coordination with treating APP.  Did note spontaneous drainage from superficial scalp abscess left occipital region.  Has had some increasing firmness in this region today so comes to ED.  No systemic symptoms including fever.  Finished antibiotics 4 days ago, notes it had significantly improved while on that.  Side ultrasound with no underlying fluid pocket.  No clear fluctuance on exam, does have some firmness to the area of skin redness.  In shared decision making, patient prefers to defer CT imaging which I believe is very  reasonable and instead trial empiric course of antibiotics-Will restart on Bactrim .  Has general surgery follow-up on 04/06/2024.  ED return precautions in place.  Patient agrees with plan. [MM]  1624 CBC(!) Mild leukocytosis and elevated platelets. [LD]  1627 Basic metabolic panel(!) Elevated glucose, otherwise unremarkable. [LD]  1628 Troponin T, High Sensitivity Not elevated. [LD]    Clinical Course User Index [LD] Cleaster Tinnie LABOR,  PA-C [MM] Clarine Ozell LABOR, MD     FINAL CLINICAL IMPRESSION(S) / ED DIAGNOSES   Final diagnoses:  Abscess of scalp  Chest pain, unspecified type     Rx / DC Orders   ED Discharge Orders          Ordered    sulfamethoxazole -trimethoprim  (BACTRIM  DS) 800-160 MG tablet  2 times daily        04/02/24 1622             Note:  This document was prepared using Dragon voice recognition software and may include unintentional dictation errors.   Cleaster Tinnie LABOR, PA-C 04/02/24 1638    Clarine Ozell LABOR, MD 04/03/24 0007  "

## 2024-04-02 NOTE — ED Triage Notes (Addendum)
 Pt has an abscess on back of neck. Had an appointment with general surgery to lance but Pt missed appt (12/16). Pt reports a new abscess has since formed and the swelling has increased to R side of neck and ear. Pt is swallowing appropriately and talking in clear sentences. GCS 15. Pt had tried PO ABX without any improvement. Pt is also reporting CP and SOB that started x1 week. PMH: COPD

## 2024-04-02 NOTE — Discharge Instructions (Addendum)
 Your blood work, EKG and chest x-ray were normal today.  I believe your chest pain is due to irritation from COPD and anxiety surrounding this.  Please continue to use your inhalers as prescribed.  Your abscess was evaluated today using ultrasound. We did not see a pocket of fluid that could be drained. Please take the antibiotics as prescribed and follow up with the general surgeon. Return to the ED if you have any worsening symptoms like development of fever, increased pain or swelling.

## 2024-04-06 ENCOUNTER — Encounter: Payer: Self-pay | Admitting: General Surgery

## 2024-04-06 ENCOUNTER — Ambulatory Visit: Payer: Self-pay | Admitting: General Surgery

## 2024-04-06 VITALS — BP 153/91 | HR 92 | Ht 61.0 in | Wt 159.0 lb

## 2024-04-06 DIAGNOSIS — L0211 Cutaneous abscess of neck: Secondary | ICD-10-CM

## 2024-04-06 NOTE — Patient Instructions (Signed)
 We will see you back in 6 weeks to see how your healing. In the meantime please give our office a call if you have any questions or concerns    Skin Abscess  A skin abscess is an infected spot of skin. It can have pus in it. An abscess can happen in any part of your body. Some abscesses break open (rupture) on their own. Most keep getting worse unless they are treated. If your abscess is not treated, the infection can spread deeper into your body and blood. This can make you feel sick. What are the causes? Germs that enter your skin. This may happen if you have: A cut or scrape. A wound from a needle or an insect bite. Blocked oil or sweat glands. A problem with the spot where your hair goes into your skin. A fluid-filled sac called a cyst under your skin. What increases the risk? Having problems with how your blood moves through your body. Having a weak body defense system (immune system). Having diabetes. Having dry and irritated skin. Needing to get shots often. Putting drugs into your body with a needle. Having a splinter or something else in your skin. Smoking. What are the signs or symptoms? A firm bump under your skin that hurts. A bump with pus at the top. Redness and swelling. Warm or tender spots. A sore on the skin. How is this treated? You may need to: Put a heat pack or a warm, wet washcloth on the spot. Have the pus drained. Take antibiotics. Follow these instructions at home: Medicines Take over-the-counter and prescription medicines only as told by your doctor. If you were prescribed antibiotics, take them as told by your doctor. Do not stop taking them even if you start to feel better. Abscess care  If you have an abscess that has not drained, put heat on it. Use the heat source that your doctor recommends, such as a moist heat pack or a heating pad. Place a towel between your skin and the heat source. Leave the heat on for 20-30 minutes. If your skin turns  bright red, take off the heat right away to prevent burns. The risk of burns is higher if you cannot feel pain, heat, or cold. Follow instructions from your doctor about how to take care of your abscess. Make sure you: Cover the abscess with a bandage. Wash your hands with soap and water for at least 20 seconds before and after you change your bandage. If you cannot use soap and water, use hand sanitizer. Change your bandage as told by your doctor. Check your abscess every day for signs that the infection is getting worse. Check for: More redness, swelling, or pain. More fluid or blood. Warmth. More pus or a worse smell. General instructions To keep the infection from spreading: Do not share personal items or towels. Do not go in a hot tub with others. Avoid making skin contact with others. Be careful when you get rid of used bandages or any pus from the abscess. Do not smoke or use any products that contain nicotine or tobacco. If you need help quitting, ask your doctor. Contact a doctor if: You see red streaks on your skin near the abscess. You have any signs of worse infection. You vomit every time you eat or drink. You have a fever, chills, or muscle aches. The cyst or abscess comes back. Get help right away if: You have very bad pain. You make less pee (urine) than normal.  This information is not intended to replace advice given to you by your health care provider. Make sure you discuss any questions you have with your health care provider. Document Revised: 10/31/2021 Document Reviewed: 10/31/2021 Elsevier Patient Education  2024 [employer].

## 2024-04-15 ENCOUNTER — Ambulatory Visit

## 2024-04-15 NOTE — Progress Notes (Signed)
 Patient ID: Andrea Kane, female   DOB: 1970/11/01, 54 y.o.   MRN: [id number] CC: Neck Abscess History of Present Illness Andrea Kane is a 54 y.o. female with past medical history significant for COPD who presents in consultation for neck abscess.  The patient reports that in December she was seen by her primary care doctor for an abscess to the back of her neck.  At that time she was referred here but unfortunately overslept her appointment.  She said that it then opened up and drained quite a bit of fluid for a few days.  It has now stopped.  She says that she has worsening pain in the area.  She denies any fevers or chills.  She denies any history of diabetes.  She was prescribed antibiotics by her the ED.Andrea Kane  Past Medical History Past Medical History:  Diagnosis Date   Anxiety    COPD (chronic obstructive pulmonary disease) (HCC)    Ectopic pregnancy        Past Surgical History:  Procedure Laterality Date   BACK SURGERY     X3   OOPHORECTOMY Left     Allergies[1]  Current Outpatient Medications  Medication Sig Dispense Refill   traZODone (DESYREL) 50 MG tablet Take 50 mg by mouth as needed for sleep.     albuterol  (VENTOLIN  HFA) 108 (90 Base) MCG/ACT inhaler Inhale 2 puffs into the lungs every 6 (six) hours as needed for wheezing or shortness of breath. 8.5 g 6   Fluticasone-Umeclidin-Vilant (TRELEGY ELLIPTA ) 100-62.5-25 MCG/ACT AEPB Inhale 1 puff into the lungs daily. 60 each 12   lisinopril  (ZESTRIL ) 10 MG tablet Take 1 tablet (10 mg total) by mouth daily. 100 tablet 1   No current facility-administered medications for this visit.    Family History Family History  Problem Relation Age of Onset   Diabetes Mother    Hypertension Father    COPD Sister    Drug abuse Sister    Cerebral palsy Son    Alzheimer's disease Maternal Grandmother    Brain cancer Maternal Grandmother    Hypertension Paternal Grandfather    Hyperlipidemia Paternal Grandfather    Cancer Other 16        cervical   Breast cancer Maternal Aunt 53   Stomach cancer Maternal Aunt        Social History Social History[2]      ROS Full ROS of systems performed and is otherwise negative there than what is stated in the HPI  Physical Exam Blood pressure (!) 153/91, pulse 92, height 5' 1 (1.549 m), weight 159 lb (72.1 kg), SpO2 97%.  Alert and oriented x 3, normal work of breathing room air, regular rate and rhythm, abdomen soft, nontender nondistended, left posterior neck there is an area of induration but I do not feel any fluctuance.  There is a little bit of surrounding erythema, no ability to express any drainage from the area.  Data Reviewed Reviewed ED and primary care notes.  She did have an abscess likely and this has since drained.  She has been put on antibiotics.  I have personally reviewed the patient's imaging and medical records.    Assessment/Plan    Patient with neck abscess that has not spontaneously drained.  Today there is no fluctuance.  I did discuss with her that this may flareup and she may have a recurrent abscess.  If this happens and I recommend her going to the emergency department for incision and drainage or  coming to our office.  Continue course of antibiotics for now.  I did discuss with her that if there is a cyst that is causing these abscesses I am happy to remove the cyst after the acute infection is gone.  A total of 45 minutes was spent reviewing the patient's chart, performing history and physical and discussing treatment options with the patient   Andrea Kane      [1] No Known Allergies [2]  Social History Tobacco Use   Smoking status: Former    Types: Cigars   Smokeless tobacco: Never   Tobacco comments:    over 6-7 years ago   Vaping Use   Vaping status: Former   Substances: CBD, Mixture of cannabinoids  Substance Use Topics   Alcohol use: No   Drug use: Not Currently

## 2024-04-28 ENCOUNTER — Encounter: Payer: Self-pay | Admitting: Licensed Clinical Social Worker

## 2024-04-28 ENCOUNTER — Telehealth: Payer: Self-pay | Admitting: Licensed Clinical Social Worker

## 2024-04-28 NOTE — Patient Instructions (Signed)
 Rexene CHRISTELLA Ferrara - I am sorry I was unable to reach you today for our scheduled appointment. I work with Vicci Duwaine SQUIBB, DO and am calling to support your healthcare needs. Please contact me at [phone] at your earliest convenience. I look forward to speaking with you soon.   Thank you,  Cena Ligas, LCSW Clinical Social Worker [facility]

## 2024-05-20 ENCOUNTER — Ambulatory Visit: Admitting: General Surgery

## 2024-09-13 ENCOUNTER — Ambulatory Visit: Admitting: Family Medicine
